# Patient Record
Sex: Male | Born: 1950
Health system: Southern US, Community
[De-identification: ages and names within clinical notes are randomized; demographics above are authoritative.]

## PROBLEM LIST (undated history)

## (undated) DIAGNOSIS — C801 Malignant (primary) neoplasm, unspecified: Secondary | ICD-10-CM

## (undated) DIAGNOSIS — D649 Anemia, unspecified: Secondary | ICD-10-CM

## (undated) DIAGNOSIS — I10 Essential (primary) hypertension: Secondary | ICD-10-CM

## (undated) DIAGNOSIS — R51 Headache: Secondary | ICD-10-CM

## (undated) DIAGNOSIS — J329 Chronic sinusitis, unspecified: Secondary | ICD-10-CM

## (undated) DIAGNOSIS — I499 Cardiac arrhythmia, unspecified: Secondary | ICD-10-CM

## (undated) DIAGNOSIS — K219 Gastro-esophageal reflux disease without esophagitis: Secondary | ICD-10-CM

## (undated) DIAGNOSIS — E785 Hyperlipidemia, unspecified: Secondary | ICD-10-CM

## (undated) DIAGNOSIS — Z9289 Personal history of other medical treatment: Secondary | ICD-10-CM

## (undated) HISTORY — DX: Gastro-esophageal reflux disease without esophagitis: K21.9

## (undated) HISTORY — PX: ELBOW SURGERY: SHX618

## (undated) HISTORY — DX: Essential (primary) hypertension: I10

## (undated) HISTORY — PX: NASAL SINUS SURGERY: SHX719

## (undated) HISTORY — DX: Hyperlipidemia, unspecified: E78.5

## (undated) HISTORY — PX: TONSILLECTOMY: SUR1361

---

## 2003-01-24 ENCOUNTER — Ambulatory Visit (HOSPITAL_COMMUNITY): Admission: RE | Admit: 2003-01-24 | Discharge: 2003-01-24 | Payer: Self-pay | Admitting: Family Medicine

## 2003-01-24 ENCOUNTER — Encounter: Payer: Self-pay | Admitting: Family Medicine

## 2003-08-30 ENCOUNTER — Ambulatory Visit (HOSPITAL_COMMUNITY): Admission: RE | Admit: 2003-08-30 | Discharge: 2003-08-30 | Payer: Self-pay | Admitting: Family Medicine

## 2004-08-05 ENCOUNTER — Ambulatory Visit (HOSPITAL_COMMUNITY): Admission: RE | Admit: 2004-08-05 | Discharge: 2004-08-05 | Payer: Self-pay | Admitting: Orthopedic Surgery

## 2004-08-05 ENCOUNTER — Ambulatory Visit (HOSPITAL_BASED_OUTPATIENT_CLINIC_OR_DEPARTMENT_OTHER): Admission: RE | Admit: 2004-08-05 | Discharge: 2004-08-05 | Payer: Self-pay | Admitting: Orthopedic Surgery

## 2012-03-29 ENCOUNTER — Other Ambulatory Visit: Payer: Self-pay | Admitting: Family Medicine

## 2012-03-29 DIAGNOSIS — R1011 Right upper quadrant pain: Secondary | ICD-10-CM

## 2012-03-30 ENCOUNTER — Other Ambulatory Visit: Payer: Self-pay

## 2012-03-31 ENCOUNTER — Ambulatory Visit
Admission: RE | Admit: 2012-03-31 | Discharge: 2012-03-31 | Disposition: A | Discharge status: Home / Self Care | Payer: Managed Care, Other (non HMO) | Source: Ambulatory Visit | Attending: Family Medicine | Admitting: Family Medicine

## 2012-03-31 DIAGNOSIS — R1011 Right upper quadrant pain: Secondary | ICD-10-CM

## 2012-04-19 ENCOUNTER — Other Ambulatory Visit (HOSPITAL_COMMUNITY): Payer: Self-pay | Admitting: Family Medicine

## 2012-04-19 DIAGNOSIS — R109 Unspecified abdominal pain: Secondary | ICD-10-CM

## 2012-04-19 DIAGNOSIS — R110 Nausea: Secondary | ICD-10-CM

## 2012-04-28 ENCOUNTER — Encounter (HOSPITAL_COMMUNITY)
Admission: RE | Admit: 2012-04-28 | Discharge: 2012-04-28 | Disposition: A | Discharge status: Home / Self Care | Payer: Managed Care, Other (non HMO) | Source: Ambulatory Visit | Attending: Family Medicine | Admitting: Family Medicine

## 2012-04-28 DIAGNOSIS — R109 Unspecified abdominal pain: Secondary | ICD-10-CM

## 2012-04-28 DIAGNOSIS — R110 Nausea: Secondary | ICD-10-CM | POA: Insufficient documentation

## 2012-04-28 DIAGNOSIS — R1011 Right upper quadrant pain: Secondary | ICD-10-CM | POA: Insufficient documentation

## 2012-04-28 MED ORDER — TECHNETIUM TC 99M MEBROFENIN IV KIT
5.3000 | PACK | Freq: Once | INTRAVENOUS | Status: AC | PRN
  Administered 2012-04-28: 5.3 via INTRAVENOUS

## 2012-05-16 ENCOUNTER — Ambulatory Visit (INDEPENDENT_AMBULATORY_CARE_PROVIDER_SITE_OTHER): Payer: Managed Care, Other (non HMO) | Admitting: General Surgery

## 2012-05-16 ENCOUNTER — Encounter (INDEPENDENT_AMBULATORY_CARE_PROVIDER_SITE_OTHER): Payer: Self-pay | Admitting: General Surgery

## 2012-05-16 VITALS — BP 126/72 | HR 68 | Temp 97.0°F | Resp 16 | Ht 68.0 in | Wt 190.8 lb

## 2012-05-16 DIAGNOSIS — K828 Other specified diseases of gallbladder: Secondary | ICD-10-CM

## 2012-05-16 NOTE — Progress Notes (Signed)
Patient ID: Kevin Mahoney, male   DOB: 08/13/1951, 61 y.o.   MRN: [id number]  Chief Complaint  Patient presents with  . Pre-op Exam    eval biliary dyskinesia    HPI Kevin Mahoney is a 61 y.o. male.  This patient is referred by Dr. Thacker for evaluation of biliary dyskinesia. He says that over the last few months he has had nausea, bloating, and right upper quadrant abdominal pain. He says that this has been present for the last few months and describes the pain as a burning and pressure sensation which is exacerbated by eating. He says it happens with most any food and has not necessarily noted any exacerbation with fatty foods. He says that the discomfort comes on quickly after eating and lasts for hours and is now occurring almost daily. He also describes some nonexertional chest pain and some heartburn which is well controlled with Prilosec. His physician actually put him on twice a day Prilosec to see if this would make a difference of and it did not improve his symptoms as well. He says that his bowels are normal anatomy normal colonoscopy 3 years ago per patient report. He denies any blood in the stools or black stools. He has had 5-6 pound weight loss.  He did have a negative RUQ US but HIDA demonstrated a 14%EF. HPI  Past Medical History  Diagnosis Date  . Hyperlipidemia   . Hypertension   . GERD (gastroesophageal reflux disease)     Past Surgical History  Procedure Date  . Elbow surgery     Family History  Problem Relation Age of Onset  . Cancer Father     lung  . Cancer Maternal Grandmother     unknown - possibly bone    Social History History  Substance Use Topics  . Smoking status: Never Smoker   . Smokeless tobacco: Never Used  . Alcohol Use: No    No Known Allergies  Current Outpatient Prescriptions  Medication Sig Dispense Refill  . omeprazole (PRILOSEC) 20 MG capsule Take 20 mg by mouth daily.      . simvastatin (ZOCOR) 40 MG tablet         Review of  Systems Review of Systems All other review of systems negative or noncontributory except as stated in the HPI  Blood pressure 126/72, pulse 68, temperature 97 F (36.1 C), temperature source Temporal, resp. rate 16, height 5' 8" (1.727 m), weight 190 lb 12.8 oz (86.546 kg).  Physical Exam Physical Exam Physical Exam  Vitals reviewed. Constitutional: He is oriented to person, place, and time. He appears well-developed and well-nourished. No distress.  HENT:  Head: Normocephalic and atraumatic.  Mouth/Throat: No oropharyngeal exudate.  Eyes: Conjunctivae and EOM are normal. Pupils are equal, round, and reactive to light. Right eye exhibits no discharge. Left eye exhibits no discharge. No scleral icterus.  Neck: Normal range of motion. No tracheal deviation present.  Cardiovascular: Normal rate, regular rhythm and normal heart sounds.   Pulmonary/Chest: Effort normal and breath sounds normal. No stridor. No respiratory distress. He has no wheezes. He has no rales. He exhibits no tenderness.  Abdominal: Soft. Bowel sounds are normal. He exhibits no distension and no mass. There is no tenderness. There is no rebound and no guarding.  Musculoskeletal: Normal range of motion. He exhibits no edema and no tenderness.  Neurological: He is alert and oriented to person, place, and time.  Skin: Skin is warm and dry. No rash noted. He is not   diaphoretic. No erythema. No pallor.  Psychiatric: He has a normal mood and affect. His behavior is normal. Judgment and thought content normal.    Data Reviewed US, HIDA  Assessment    Biliary dyskinesia His symptoms do sound concerning for biliary dyskinesia. He does have the abdominal pain, bloating and nausea as well as a the skin with an ejection fraction of 14%. We'll long discussion about the options for continued workup versus proceeding with surgery and I think that we have enough evidence to go ahead and offer him a cholecystectomy for possible  relief. I explained that his biggest risk of the surgery would be that he has continued symptoms postoperatively. We also discussed the risks of cholecystectomy. The risks of infection, bleeding, pain, persistent symptoms, scarring, injury to bowel or bile ducts, retained stone, diarrhea, need for additional procedures, and need for open surgery discussed with the patient.  Since he has daily symptoms and he says that his quality-of-life is suffering, he would like to go ahead and proceed with cholecystectomy for possible relief. We will go ahead and set him up for cholecystectomy when convenient     Plan    We will schedule him for laparoscopic cholecystectomy for possible relief of his symptoms.      Iniya Matzek DAVID 05/16/2012, 4:37 PM    

## 2012-05-25 ENCOUNTER — Encounter (HOSPITAL_COMMUNITY): Payer: Self-pay | Admitting: Pharmacy Technician

## 2012-05-31 NOTE — Pre-Procedure Instructions (Signed)
21 Kevin Mahoney  05/31/2012   Your procedure is scheduled on:  Thursday October 234, 2013  Report to [facility] at 5:30 AM.  Call this number if you have problems the morning of surgery: [phone]   Remember:   Do not eat food or drink After Midnight.      Take these medicines the morning of surgery with A SIP OF WATER: omeprazole   Do not wear jewelry, make-up or nail polish.  Do not wear lotions, powders, or perfumes.   Do not shave 48 hours prior to surgery. Men may shave face and neck.  Do not bring valuables to the hospital.  Contacts, dentures or bridgework may not be worn into surgery.  Leave suitcase in the car. After surgery it may be brought to your room.  For patients admitted to the hospital, checkout time is 11:00 AM the day of discharge.   Patients discharged the day of surgery will not be allowed to drive home.  Name and phone number of your driver: family / friend  Special Instructions: Shower using CHG 2 nights before surgery and the night before surgery.  If you shower the day of surgery use CHG.  Use special wash - you have one bottle of CHG for all showers.  You should use approximately 1/3 of the bottle for each shower.   Please read over the following fact sheets that you were given: Pain Booklet, Coughing and Deep Breathing, MRSA Information and Surgical Site Infection Prevention

## 2012-06-01 ENCOUNTER — Ambulatory Visit (HOSPITAL_COMMUNITY)
Admission: RE | Admit: 2012-06-01 | Discharge: 2012-06-01 | Disposition: A | Discharge status: Home / Self Care | Payer: Managed Care, Other (non HMO) | Source: Ambulatory Visit | Attending: General Surgery | Admitting: General Surgery

## 2012-06-01 ENCOUNTER — Encounter (HOSPITAL_COMMUNITY): Payer: Self-pay

## 2012-06-01 ENCOUNTER — Encounter (HOSPITAL_COMMUNITY)
Admission: RE | Admit: 2012-06-01 | Discharge: 2012-06-01 | Payer: Managed Care, Other (non HMO) | Source: Ambulatory Visit | Attending: General Surgery | Admitting: General Surgery

## 2012-06-01 VITALS — BP 127/84 | HR 73 | Temp 98.5°F | Resp 20 | Ht 68.0 in | Wt 195.8 lb

## 2012-06-01 DIAGNOSIS — Z01810 Encounter for preprocedural cardiovascular examination: Secondary | ICD-10-CM | POA: Insufficient documentation

## 2012-06-01 DIAGNOSIS — K828 Other specified diseases of gallbladder: Secondary | ICD-10-CM

## 2012-06-01 DIAGNOSIS — Z01818 Encounter for other preprocedural examination: Secondary | ICD-10-CM | POA: Insufficient documentation

## 2012-06-01 DIAGNOSIS — I10 Essential (primary) hypertension: Secondary | ICD-10-CM | POA: Insufficient documentation

## 2012-06-01 DIAGNOSIS — Z01812 Encounter for preprocedural laboratory examination: Secondary | ICD-10-CM | POA: Insufficient documentation

## 2012-06-01 HISTORY — DX: Chronic sinusitis, unspecified: J32.9

## 2012-06-01 HISTORY — DX: Personal history of other medical treatment: Z92.89

## 2012-06-01 HISTORY — DX: Anemia, unspecified: D64.9

## 2012-06-01 HISTORY — DX: Malignant (primary) neoplasm, unspecified: C80.1

## 2012-06-01 HISTORY — DX: Cardiac arrhythmia, unspecified: I49.9

## 2012-06-01 HISTORY — DX: Headache: R51

## 2012-06-01 LAB — CBC WITH DIFFERENTIAL/PLATELET
Basophils Absolute: 0.1 10*3/uL (ref 0.0–0.1)
Eosinophils Relative: 5 % (ref 0–5)
Lymphocytes Relative: 35 % (ref 12–46)
Monocytes Relative: 9 % (ref 3–12)
Neutrophils Relative %: 50 % (ref 43–77)
Platelets: 199 10*3/uL (ref 150–400)
RBC: 4.96 MIL/uL (ref 4.22–5.81)
RDW: 19.3 % — ABNORMAL HIGH (ref 11.5–15.5)
WBC: 5.5 10*3/uL (ref 4.0–10.5)

## 2012-06-01 LAB — COMPREHENSIVE METABOLIC PANEL
ALT: 19 U/L (ref 0–53)
AST: 32 U/L (ref 0–37)
Albumin: 4 g/dL (ref 3.5–5.2)
CO2: 26 mEq/L (ref 19–32)
Chloride: 104 mEq/L (ref 96–112)
GFR calc non Af Amer: >90 mL/min (ref >90)
Sodium: 139 mEq/L (ref 135–145)
Total Bilirubin: 0.3 mg/dL (ref 0.3–1.2)

## 2012-06-01 LAB — SURGICAL PCR SCREEN
MRSA, PCR: NEGATIVE
Staphylococcus aureus: POSITIVE — AB

## 2012-06-06 ENCOUNTER — Telehealth (INDEPENDENT_AMBULATORY_CARE_PROVIDER_SITE_OTHER): Payer: Self-pay

## 2012-06-06 NOTE — Telephone Encounter (Signed)
The pt called stating he is being treated for a sinus infection with Bactrim.  His surgery is 10/24.  He wanted to know if he should take it the morning of surgery.  I told him not to.  I told him to make sure he lets the hospital know he is on it and Anesthesia will check him that morning.

## 2012-06-07 MED ORDER — CEFAZOLIN SODIUM-DEXTROSE 2-3 GM-% IV SOLR
2.0000 g | INTRAVENOUS | Status: AC
  Administered 2012-06-08: 2 g via INTRAVENOUS
  Filled 2012-06-07: qty 50

## 2012-06-08 ENCOUNTER — Ambulatory Visit (HOSPITAL_COMMUNITY)
Admission: RE | Admit: 2012-06-08 | Discharge: 2012-06-08 | Disposition: A | Discharge status: Home / Self Care | Payer: Managed Care, Other (non HMO) | Source: Ambulatory Visit | Attending: General Surgery | Admitting: General Surgery

## 2012-06-08 ENCOUNTER — Encounter (HOSPITAL_COMMUNITY): Payer: Self-pay | Admitting: Surgery

## 2012-06-08 ENCOUNTER — Ambulatory Visit (HOSPITAL_COMMUNITY): Payer: Managed Care, Other (non HMO)

## 2012-06-08 ENCOUNTER — Encounter (HOSPITAL_COMMUNITY): Payer: Self-pay | Admitting: Anesthesiology

## 2012-06-08 ENCOUNTER — Ambulatory Visit (HOSPITAL_COMMUNITY): Payer: Managed Care, Other (non HMO) | Admitting: Anesthesiology

## 2012-06-08 ENCOUNTER — Encounter (HOSPITAL_COMMUNITY)
Admission: RE | Disposition: A | Discharge status: Home / Self Care | Payer: Self-pay | Source: Ambulatory Visit | Attending: General Surgery

## 2012-06-08 DIAGNOSIS — K811 Chronic cholecystitis: Secondary | ICD-10-CM

## 2012-06-08 DIAGNOSIS — K828 Other specified diseases of gallbladder: Secondary | ICD-10-CM | POA: Insufficient documentation

## 2012-06-08 DIAGNOSIS — I10 Essential (primary) hypertension: Secondary | ICD-10-CM | POA: Insufficient documentation

## 2012-06-08 HISTORY — PX: CHOLECYSTECTOMY: SHX55

## 2012-06-08 SURGERY — LAPAROSCOPIC CHOLECYSTECTOMY WITH INTRAOPERATIVE CHOLANGIOGRAM
Anesthesia: General | Wound class: Contaminated

## 2012-06-08 MED ORDER — LIDOCAINE-EPINEPHRINE (PF) 1 %-1:200000 IJ SOLN
INTRAMUSCULAR | Status: DC | PRN
  Administered 2012-06-08: 09:00:00 via SUBCUTANEOUS

## 2012-06-08 MED ORDER — DEXTROSE 5 % IV SOLN
INTRAVENOUS | Status: DC | PRN
  Administered 2012-06-08: 08:00:00 via INTRAVENOUS

## 2012-06-08 MED ORDER — HYDROMORPHONE HCL PF 1 MG/ML IJ SOLN
0.2500 mg | INTRAMUSCULAR | Status: DC | PRN
  Administered 2012-06-08 (×4): 0.5 mg via INTRAVENOUS

## 2012-06-08 MED ORDER — LIDOCAINE HCL (CARDIAC) 20 MG/ML IV SOLN
INTRAVENOUS | Status: DC | PRN
  Administered 2012-06-08: 90 mg via INTRAVENOUS

## 2012-06-08 MED ORDER — GLYCOPYRROLATE 0.2 MG/ML IJ SOLN
INTRAMUSCULAR | Status: DC | PRN
  Administered 2012-06-08: .4 mg via INTRAVENOUS
  Administered 2012-06-08: 0.4 mg via INTRAVENOUS

## 2012-06-08 MED ORDER — FENTANYL CITRATE 0.05 MG/ML IJ SOLN
INTRAMUSCULAR | Status: DC | PRN
  Administered 2012-06-08 (×3): 50 ug via INTRAVENOUS

## 2012-06-08 MED ORDER — LIDOCAINE-EPINEPHRINE (PF) 1 %-1:200000 IJ SOLN
INTRAMUSCULAR | Status: AC
  Filled 2012-06-08: qty 10

## 2012-06-08 MED ORDER — SODIUM CHLORIDE 0.9 % IR SOLN
Status: DC | PRN
  Administered 2012-06-08: 1000 mL

## 2012-06-08 MED ORDER — 0.9 % SODIUM CHLORIDE (POUR BTL) OPTIME
TOPICAL | Status: DC | PRN
  Administered 2012-06-08: 1000 mL

## 2012-06-08 MED ORDER — METOCLOPRAMIDE HCL 5 MG/ML IJ SOLN
10.0000 mg | Freq: Once | INTRAMUSCULAR | Status: AC | PRN
  Administered 2012-06-08: 10 mg via INTRAVENOUS

## 2012-06-08 MED ORDER — METOCLOPRAMIDE HCL 5 MG/ML IJ SOLN
INTRAMUSCULAR | Status: AC
  Filled 2012-06-08: qty 2

## 2012-06-08 MED ORDER — SODIUM CHLORIDE 0.9 % IV SOLN
INTRAVENOUS | Status: DC | PRN
  Administered 2012-06-08: 09:00:00

## 2012-06-08 MED ORDER — DEXAMETHASONE SODIUM PHOSPHATE 4 MG/ML IJ SOLN
INTRAMUSCULAR | Status: DC | PRN
  Administered 2012-06-08: 10 mg via INTRAVENOUS

## 2012-06-08 MED ORDER — BUPIVACAINE HCL (PF) 0.25 % IJ SOLN
INTRAMUSCULAR | Status: AC
  Filled 2012-06-08: qty 30

## 2012-06-08 MED ORDER — LACTATED RINGERS IV SOLN
INTRAVENOUS | Status: DC | PRN
  Administered 2012-06-08 (×2): via INTRAVENOUS

## 2012-06-08 MED ORDER — HYDROMORPHONE HCL PF 1 MG/ML IJ SOLN
INTRAMUSCULAR | Status: AC
  Filled 2012-06-08: qty 1

## 2012-06-08 MED ORDER — HYDROCODONE-ACETAMINOPHEN 5-325 MG PO TABS: 1.0000 | ORAL_TABLET | ORAL | Status: DC | PRN

## 2012-06-08 MED ORDER — PROPOFOL 10 MG/ML IV BOLUS
INTRAVENOUS | Status: DC | PRN
  Administered 2012-06-08: 200 mg via INTRAVENOUS

## 2012-06-08 MED ORDER — OXYCODONE HCL 5 MG PO TABS: 5.0000 mg | ORAL_TABLET | Freq: Once | ORAL | Status: DC | PRN

## 2012-06-08 MED ORDER — NEOSTIGMINE METHYLSULFATE 1 MG/ML IJ SOLN
INTRAMUSCULAR | Status: DC | PRN
  Administered 2012-06-08: 2 mg via INTRAVENOUS
  Administered 2012-06-08: 3 mg via INTRAVENOUS

## 2012-06-08 MED ORDER — ONDANSETRON HCL 4 MG/2ML IJ SOLN
INTRAMUSCULAR | Status: DC | PRN
  Administered 2012-06-08: 4 mg via INTRAVENOUS

## 2012-06-08 MED ORDER — OXYCODONE HCL 5 MG/5ML PO SOLN: 5.0000 mg | Freq: Once | ORAL | Status: DC | PRN

## 2012-06-08 MED ORDER — OXYCODONE HCL 5 MG PO TABS
ORAL_TABLET | ORAL | Status: AC
  Filled 2012-06-08: qty 1

## 2012-06-08 MED ORDER — ROCURONIUM BROMIDE 100 MG/10ML IV SOLN
INTRAVENOUS | Status: DC | PRN
  Administered 2012-06-08: 50 mg via INTRAVENOUS

## 2012-06-08 MED ORDER — MIDAZOLAM HCL 5 MG/5ML IJ SOLN
INTRAMUSCULAR | Status: DC | PRN
  Administered 2012-06-08 (×2): 1 mg via INTRAVENOUS

## 2012-06-08 SURGICAL SUPPLY — 49 items
ADH SKN CLS APL DERMABOND .7 (GAUZE/BANDAGES/DRESSINGS) ×1
APPLIER CLIP ROT 10 11.4 M/L (STAPLE) ×2
APR CLP MED LRG 11.4X10 (STAPLE) ×1
BAG SPEC RTRVL LRG 6X4 10 (ENDOMECHANICALS) ×1
BLADE SURG ROTATE 9660 (MISCELLANEOUS) ×1 IMPLANT
CANISTER SUCTION 2500CC (MISCELLANEOUS) ×2 IMPLANT
CATH REDDICK CHOLANGI 4FR 50CM (CATHETERS) ×2 IMPLANT
CHLORAPREP W/TINT 26ML (MISCELLANEOUS) ×2 IMPLANT
CLIP APPLIE ROT 10 11.4 M/L (STAPLE) ×1 IMPLANT
CLOTH BEACON ORANGE TIMEOUT ST (SAFETY) ×2 IMPLANT
COVER SURGICAL LIGHT HANDLE (MISCELLANEOUS) ×2 IMPLANT
DECANTER SPIKE VIAL GLASS SM (MISCELLANEOUS) ×4 IMPLANT
DERMABOND ADVANCED (GAUZE/BANDAGES/DRESSINGS) ×1
DERMABOND ADVANCED .7 DNX12 (GAUZE/BANDAGES/DRESSINGS) ×1 IMPLANT
DRAPE C-ARM 42X72 X-RAY (DRAPES) ×2 IMPLANT
ELECT CAUTERY BLADE 6.4 (BLADE) ×2 IMPLANT
ELECT REM PT RETURN 9FT ADLT (ELECTROSURGICAL) ×2
ELECTRODE REM PT RTRN 9FT ADLT (ELECTROSURGICAL) ×1 IMPLANT
GLOVE BIO SURGEON STRL SZ7.5 (GLOVE) ×1 IMPLANT
GLOVE BIOGEL PI IND STRL 7.0 (GLOVE) IMPLANT
GLOVE BIOGEL PI IND STRL 7.5 (GLOVE) IMPLANT
GLOVE BIOGEL PI IND STRL 8 (GLOVE) IMPLANT
GLOVE BIOGEL PI INDICATOR 7.0 (GLOVE) ×1
GLOVE BIOGEL PI INDICATOR 7.5 (GLOVE) ×1
GLOVE BIOGEL PI INDICATOR 8 (GLOVE) ×2
GLOVE SURG SS PI 7.5 STRL IVOR (GLOVE) ×4 IMPLANT
GLOVE SURG SS PI 8.0 STRL IVOR (GLOVE) ×2 IMPLANT
GOWN STRL NON-REIN LRG LVL3 (GOWN DISPOSABLE) ×6 IMPLANT
GOWN STRL REIN XL XLG (GOWN DISPOSABLE) ×2 IMPLANT
IV CATH 14GX2 1/4 (CATHETERS) ×2 IMPLANT
KIT BASIN OR (CUSTOM PROCEDURE TRAY) ×2 IMPLANT
KIT ROOM TURNOVER OR (KITS) ×2 IMPLANT
NS IRRIG 1000ML POUR BTL (IV SOLUTION) ×2 IMPLANT
PAD ARMBOARD 7.5X6 YLW CONV (MISCELLANEOUS) ×2 IMPLANT
PENCIL BUTTON HOLSTER BLD 10FT (ELECTRODE) ×2 IMPLANT
POUCH SPECIMEN RETRIEVAL 10MM (ENDOMECHANICALS) ×2 IMPLANT
SCISSORS LAP 5X35 DISP (ENDOMECHANICALS) IMPLANT
SET IRRIG TUBING LAPAROSCOPIC (IRRIGATION / IRRIGATOR) ×2 IMPLANT
SLEEVE ENDOPATH XCEL 5M (ENDOMECHANICALS) ×2 IMPLANT
SPECIMEN JAR SMALL (MISCELLANEOUS) ×2 IMPLANT
SUT MNCRL AB 4-0 PS2 18 (SUTURE) ×4 IMPLANT
SUT VICRYL 0 UR6 27IN ABS (SUTURE) ×2 IMPLANT
TOWEL OR 17X24 6PK STRL BLUE (TOWEL DISPOSABLE) ×2 IMPLANT
TOWEL OR 17X26 10 PK STRL BLUE (TOWEL DISPOSABLE) ×2 IMPLANT
TRAY FOLEY CATH 14FR (SET/KITS/TRAYS/PACK) IMPLANT
TRAY LAPAROSCOPIC (CUSTOM PROCEDURE TRAY) ×2 IMPLANT
TROCAR BALLN 12MMX100 BLUNT (TROCAR) ×2 IMPLANT
TROCAR XCEL NON-BLD 11X100MML (ENDOMECHANICALS) ×2 IMPLANT
TROCAR XCEL NON-BLD 5MMX100MML (ENDOMECHANICALS) ×2 IMPLANT

## 2012-06-08 NOTE — Anesthesia Preprocedure Evaluation (Signed)
Anesthesia Evaluation  Patient identified by MRN, date of birth, ID band Patient awake    Reviewed: Allergy & Precautions, H&P , NPO status , Patient's Chart, lab work & pertinent test results, reviewed documented beta blocker date and time   Airway Mallampati: II TM Distance: >3 FB Neck ROM: full    Dental   Pulmonary neg pulmonary ROS,  breath sounds clear to auscultation        Cardiovascular hypertension, Pt. on medications negative cardio ROS  + dysrhythmias Rhythm:regular     Neuro/Psych  Headaches, negative psych ROS   GI/Hepatic Neg liver ROS, GERD-  Medicated and Controlled,  Endo/Other  negative endocrine ROS  Renal/GU negative Renal ROS  negative genitourinary   Musculoskeletal   Abdominal   Peds  Hematology negative hematology ROS (+)   Anesthesia Other Findings See surgeon's H&P   Reproductive/Obstetrics negative OB ROS                           Anesthesia Physical Anesthesia Plan  ASA: II  Anesthesia Plan: General   Post-op Pain Management:    Induction: Intravenous  Airway Management Planned: Oral ETT  Additional Equipment:   Intra-op Plan:   Post-operative Plan: Extubation in OR  Informed Consent: I have reviewed the patients History and Physical, chart, labs and discussed the procedure including the risks, benefits and alternatives for the proposed anesthesia with the patient or authorized representative who has indicated his/her understanding and acceptance.   Dental Advisory Given  Plan Discussed with: CRNA and Surgeon  Anesthesia Plan Comments:         Anesthesia Quick Evaluation

## 2012-06-08 NOTE — H&P (View-Only) (Signed)
Patient ID: Kevin Mahoney, male   DOB: 11/11/1950, 61 y.o.   MRN: [id number]  Chief Complaint  Patient presents with  . Pre-op Exam    eval biliary dyskinesia    HPI Kevin Mahoney is a 61 y.o. male.  This patient is referred by Dr. Abigail Miyamoto for evaluation of biliary dyskinesia. He says that over the last few months he has had nausea, bloating, and right upper quadrant abdominal pain. He says that this has been present for the last few months and describes the pain as a burning and pressure sensation which is exacerbated by eating. He says it happens with most any food and has not necessarily noted any exacerbation with fatty foods. He says that the discomfort comes on quickly after eating and lasts for hours and is now occurring almost daily. He also describes some nonexertional chest pain and some heartburn which is well controlled with Prilosec. His physician actually put him on twice a day Prilosec to see if this would make a difference of and it did not improve his symptoms as well. He says that his bowels are normal anatomy normal colonoscopy 3 years ago per patient report. He denies any blood in the stools or black stools. He has had 5-6 pound weight loss.  He did have a negative RUQ [place] but HIDA demonstrated a 14%EF. HPI  Past Medical History  Diagnosis Date  . Hyperlipidemia   . Hypertension   . GERD (gastroesophageal reflux disease)     Past Surgical History  Procedure Date  . Elbow surgery     Family History  Problem Relation Age of Onset  . Cancer Father     lung  . Cancer Maternal Grandmother     unknown - possibly bone    Social History History  Substance Use Topics  . Smoking status: Never Smoker   . Smokeless tobacco: Never Used  . Alcohol Use: No    No Known Allergies  Current Outpatient Prescriptions  Medication Sig Dispense Refill  . omeprazole (PRILOSEC) 20 MG capsule Take 20 mg by mouth daily.      . simvastatin (ZOCOR) 40 MG tablet         Review of  Systems Review of Systems All other review of systems negative or noncontributory except as stated in the HPI  Blood pressure 126/72, pulse 68, temperature 97 F (36.1 C), temperature source Temporal, resp. rate 16, height 5\' 8"  (1.727 m), weight 190 lb 12.8 oz (86.546 kg).  Physical Exam Physical Exam Physical Exam  Vitals reviewed. Constitutional: He is oriented to person, place, and time. He appears well-developed and well-nourished. No distress.  HENT:  Head: Normocephalic and atraumatic.  Mouth/Throat: No oropharyngeal exudate.  Eyes: Conjunctivae and EOM are normal. Pupils are equal, round, and reactive to light. Right eye exhibits no discharge. Left eye exhibits no discharge. No scleral icterus.  Neck: Normal range of motion. No tracheal deviation present.  Cardiovascular: Normal rate, regular rhythm and normal heart sounds.   Pulmonary/Chest: Effort normal and breath sounds normal. No stridor. No respiratory distress. He has no wheezes. He has no rales. He exhibits no tenderness.  Abdominal: Soft. Bowel sounds are normal. He exhibits no distension and no mass. There is no tenderness. There is no rebound and no guarding.  Musculoskeletal: Normal range of motion. He exhibits no edema and no tenderness.  Neurological: He is alert and oriented to person, place, and time.  Skin: Skin is warm and dry. No rash noted. He is not  diaphoretic. No erythema. No pallor.  Psychiatric: He has a normal mood and affect. His behavior is normal. Judgment and thought content normal.    Data Reviewed [place], HIDA  Assessment    Biliary dyskinesia His symptoms do sound concerning for biliary dyskinesia. He does have the abdominal pain, bloating and nausea as well as a the skin with an ejection fraction of 14%. We'll long discussion about the options for continued workup versus proceeding with surgery and I think that we have enough evidence to go ahead and offer him a cholecystectomy for possible  relief. I explained that his biggest risk of the surgery would be that he has continued symptoms postoperatively. We also discussed the risks of cholecystectomy. The risks of infection, bleeding, pain, persistent symptoms, scarring, injury to bowel or bile ducts, retained stone, diarrhea, need for additional procedures, and need for open surgery discussed with the patient.  Since he has daily symptoms and he says that his quality-of-life is suffering, he would like to go ahead and proceed with cholecystectomy for possible relief. We will go ahead and set him up for cholecystectomy when convenient     Plan    We will schedule him for laparoscopic cholecystectomy for possible relief of his symptoms.      Lodema Pilot DAVID 05/16/2012, 4:37 PM

## 2012-06-08 NOTE — Progress Notes (Signed)
Pt nauseated when moved up to wheelchair. Reglan IV given as ordered.  Pt feeling better.

## 2012-06-08 NOTE — Transfer of Care (Signed)
Immediate Anesthesia Transfer of Care Note  Patient: Kevin Mahoney  Procedure(s) Performed: Procedure(s) (LRB) with comments: LAPAROSCOPIC CHOLECYSTECTOMY WITH INTRAOPERATIVE CHOLANGIOGRAM (N/A)  Patient Location: PACU  Anesthesia Type: General  Level of Consciousness: awake, alert  and patient cooperative  Airway & Oxygen Therapy: Patient Spontanous Breathing and Patient connected to face mask oxygen  Post-op Assessment: Report given to PACU RN, Post -op Vital signs reviewed and stable and Patient moving all extremities  Post vital signs: Reviewed and stable  Complications: No apparent anesthesia complications

## 2012-06-08 NOTE — Addendum Note (Signed)
Addendum  created 06/08/12 1615 by Marni Griffon, CRNA   Modules edited:Anesthesia Medication Administration

## 2012-06-08 NOTE — Anesthesia Postprocedure Evaluation (Signed)
Anesthesia Post Note  Patient: Kevin Mahoney  Procedure(s) Performed: Procedure(s) (LRB): LAPAROSCOPIC CHOLECYSTECTOMY WITH INTRAOPERATIVE CHOLANGIOGRAM (N/A)  Anesthesia type: General  Patient location: PACU  Post pain: Pain level controlled  Post assessment: Patient's Cardiovascular Status Stable  Last Vitals:  Filed Vitals:   06/08/12 1030  BP: 134/74  Pulse: 83  Temp:   Resp: 16    Post vital signs: Reviewed and stable  Level of consciousness: alert  Complications: No apparent anesthesia complications

## 2012-06-08 NOTE — Anesthesia Procedure Notes (Signed)
Procedure Name: Intubation Date/Time: 06/08/2012 7:53 AM Performed by: Marni Griffon Pre-anesthesia Checklist: Patient identified, Emergency Drugs available, Suction available and Patient being monitored Patient Re-evaluated:Patient Re-evaluated prior to inductionOxygen Delivery Method: Circle system utilized Preoxygenation: Pre-oxygenation with 100% oxygen Intubation Type: IV induction Ventilation: Mask ventilation without difficulty Laryngoscope Size: Mac and 3 (could have used length of Mac 4) Grade View: Grade II Tube type: Oral Tube size: 8.0 mm Number of attempts: 1 Airway Equipment and Method: Stylet Placement Confirmation: ETT inserted through vocal cords under direct vision,  positive ETCO2,  CO2 detector and breath sounds checked- equal and bilateral Secured at: 22 (cm at teeth) cm Tube secured with: Tape Dental Injury: Teeth and Oropharynx as per pre-operative assessment

## 2012-06-08 NOTE — Preoperative (Signed)
Beta Blockers   Reason not to administer Beta Blockers:Not Applicable 

## 2012-06-08 NOTE — Interval H&P Note (Signed)
History and Physical Interval Note:  06/08/2012 7:23 AM  Kevin Mahoney  has presented today for surgery, with the diagnosis of bilinary dyskinesia  The various methods of treatment have been discussed with the patient and family. After consideration of risks, benefits and other options for treatment, the patient has consented to  Procedure(s) (LRB) with comments: LAPAROSCOPIC CHOLECYSTECTOMY WITH INTRAOPERATIVE CHOLANGIOGRAM (N/A) as a surgical intervention .  The patient's history has been reviewed, patient examined, no change in status, stable for surgery.  I have reviewed the patient's chart and labs.  Questions were answered to the patient's satisfaction.  He was seen and evaluated in the preop area.  Risks of the procedure again discussed.  The risks of infection, bleeding, pain, persistent symptoms, scarring, injury to bowel or bile ducts, retained stone, diarrhea, need for additional procedures, and need for open surgery discussed with the patient.  Since he has no stones, I again explained that his biggest risk may be that he has persistent symptoms.  He expressed understanding and desires to proceed with lap chole/IOC possible open.    Lodema Pilot DAVID

## 2012-06-08 NOTE — Brief Op Note (Signed)
06/08/2012  9:23 AM  PATIENT:  Kevin Mahoney  61 y.o. male  PRE-OPERATIVE DIAGNOSIS:  bilinary dyskinesia  POST-OPERATIVE DIAGNOSIS:  bilinary dyskinesia  PROCEDURE:  Procedure(s) (LRB) with comments: LAPAROSCOPIC CHOLECYSTECTOMY WITH INTRAOPERATIVE CHOLANGIOGRAM (N/A)  SURGEON:  Surgeon(s) and Role:    * Lodema Pilot, DO - Primary  PHYSICIAN ASSISTANT:   ASSISTANTS: none   ANESTHESIA:   general  EBL:  Total I/O In: 1050 [I.V.:1050] Out: -   BLOOD ADMINISTERED:none  DRAINS: none   LOCAL MEDICATIONS USED:  MARCAINE    and LIDOCAINE   SPECIMEN:  Source of Specimen:  gallbladder  DISPOSITION OF SPECIMEN:  PATHOLOGY  COUNTS:  YES  TOURNIQUET:  * No tourniquets in log *  DICTATION: .Other Dictation: Dictation Number   PLAN OF CARE: Discharge to home after PACU  PATIENT DISPOSITION:  PACU - hemodynamically stable.   Delay start of Pharmacological VTE agent (>24hrs) due to surgical blood loss or risk of bleeding: no

## 2012-06-08 NOTE — Op Note (Signed)
Kevin Mahoney, Kevin Mahoney NO.:  [id number]  MEDICAL RECORD NO.:  [id number]  LOCATION:  [facility]                         FACILITY:  [facility]  PHYSICIAN:  Lodema Pilot, MD       DATE OF BIRTH:  1951-06-01  DATE OF PROCEDURE:  06/08/2012 DATE OF DISCHARGE:                              OPERATIVE REPORT   PROCEDURE:  Laparoscopic cholecystectomy with intraoperative cholangiogram.  PREOPERATIVE DIAGNOSIS:  Biliary dyskinesia.  POSTOPERATIVE DIAGNOSIS:  Biliary dyskinesia.  SURGEON:  Lodema Pilot, MD  ASSISTANT:  None.  ANESTHESIA:  General endotracheal anesthesia with 30 mL of 1% lidocaine with epinephrine and 0.25% Marcaine in a 50:50 mixture.  FLUIDS:  1500 mL of crystalloid.  ESTIMATED BLOOD LOSS:  Minimal.  DRAINS:  None.  SPECIMENS:  Gallbladder and contents sent to Pathology for permanent sectioning.  FINDINGS:  Normal-appearing gallbladder with no evidence of gallstones, normal cholangiogram.  INDICATION FOR PROCEDURE:  Kevin Mahoney is a 61 year old male with postprandial right upper quadrant pain and nausea and bloating and HIDA scan with a low ejection fraction consistent with biliary dyskinesia.  OPERATIVE DETAILS:  Kevin Mahoney was seen and evaluated in the preoperative area and risks and benefits of procedure were discussed in lay terms. Informed consent was obtained.  I again discussed with him the risk of possibility of persistent symptoms given the lack of gallstones and he expressed understanding and desired to proceed with cholecystectomy.  He was given prophylactic antibiotics and taken to the operating room, placed on the table in supine position, and general endotracheal anesthesia was obtained.  His abdomen was prepped and draped in a standard surgical fashion.  A supraumbilical midline incision was made in the skin and dissection carried down to the subcutaneous tissue using blunt dissection.  The abdominal wall fascia was elevated and  sharply incised and the peritoneum entered bluntly.  A 12-mm balloon port was placed under direct visualization and pneumoperitoneum was obtained. Laparoscope was introduced and there was no evidence of bowel injury upon entry.  An 11-mm epigastric trocar was placed and two 5 mm right upper quadrant trocars were placed under direct visualization and the gallbladder was retracted cephalad.  There was no obvious inflammatory change or edema.  There were some adhesions from the colon onto the gallbladder.  These were easily taken down bluntly and no cautery was used.  The peritoneum was taken down off the gallbladder using blunt dissection and the cystic artery and cystic duct were easily visualized in the usual anatomic position.  The artery and duct were skeletonized and a critical view of safety was obtained visualizing a single cystic duct and single cystic artery coursing onto the gallbladder with the liver parenchyma visible through the triangle of Calot.  Two clips were placed on the stay side of the artery and a single clip on the distal side, but was not divided at this time.  Clip was placed on the cystic duct and cystic ductotomy was made and the cholangiogram catheter was passed through the abdominal wall and placed in the cystic duct and clipped into place.  Cholangiogram was performed, which demonstrated small cystic duct and a small common bile duct,  but free flow of bile into the duodenum with normal right and left hepatic ducts and no evidence of any filling defects.  Cholangiogram catheter was removed and 2 clips were placed on the cystic duct stump and the duct was transected.  The artery which was already clipped was divided.  Then the gallbladder was removed from the gallbladder fossa using Bovie electrocautery and the gallbladder was not entered during the dissection, although there was some leakage of bile from the cystic duct region of the gallbladder, but there was  no leakage of any gallstones. The gallbladder was completely removed from the gallbladder fossa and removed from the umbilical trocar in an EndoCatch bag and passed off the table and sent to Pathology for permanent sectioning.  The gallbladder fossa was inspected for hemostasis which was obtained with Bovie electrocautery and was noted to be hemostatic.  Clips were appeared to be in good position.  The right upper quadrant trocars were removed under direct visualization.  There was a little bit of bleeding from the more medial 5 mm trocar site and this was controlled laparoscopically with some Bovie electrocautery and injection of epinephrine.  The abdominal wall was then noted to be hemostatic.  The right upper quadrant noted to be hemostatic.  There was no evidence of bleeding or bowel injury.  The umbilical fascia was approximated with interrupted 0 Vicryl sutures in open fashion and the sutures were secured and the abdomen was re-insufflated with carbon dioxide gas.  The abdominal wall closure was noted to be adequate without any evidence of bowel injury or bleeding.  The right upper quadrant and abdominal wall were noted to be hemostatic and the final trocar was removed.  The skin edges were injected with 30 mL of 1% lidocaine with epinephrine and 0.25% Marcaine in a 50:50 mixture and the skin edges were approximated with 4-0 Monocryl subcuticular suture to all skin incisions.  Skin was washed and dried.  Dermabond was applied.  All sponge, needle, and instrument counts were correct at the end of the case.  The patient tolerated the procedure well without apparent complications.          ______________________________ Lodema Pilot, MD     BL/MEDQ  D:  06/08/2012  T:  06/08/2012  Job:  [id number]

## 2012-06-12 ENCOUNTER — Encounter (HOSPITAL_COMMUNITY): Payer: Self-pay | Admitting: General Surgery

## 2012-06-28 ENCOUNTER — Encounter (INDEPENDENT_AMBULATORY_CARE_PROVIDER_SITE_OTHER): Payer: Self-pay | Admitting: General Surgery

## 2012-06-28 ENCOUNTER — Ambulatory Visit (INDEPENDENT_AMBULATORY_CARE_PROVIDER_SITE_OTHER): Payer: Managed Care, Other (non HMO) | Admitting: General Surgery

## 2012-06-28 ENCOUNTER — Encounter (INDEPENDENT_AMBULATORY_CARE_PROVIDER_SITE_OTHER): Payer: Managed Care, Other (non HMO) | Admitting: General Surgery

## 2012-06-28 VITALS — BP 124/80 | HR 71 | Temp 97.6°F | Resp 18 | Ht 68.0 in | Wt 192.0 lb

## 2012-06-28 DIAGNOSIS — Z4889 Encounter for other specified surgical aftercare: Secondary | ICD-10-CM

## 2012-06-28 DIAGNOSIS — Z5189 Encounter for other specified aftercare: Secondary | ICD-10-CM

## 2012-06-28 NOTE — Progress Notes (Signed)
Subjective:     Patient ID: Kevin Mahoney, male   DOB: 10/28/1950, 61 y.o.   MRN: [id number]  HPI This patient follows up status post left upper cholecystectomy for suspected biliary dyskinesia. He says that he feels much better after his procedure and that his preoperative nausea has now improved. He says his stomach feels better as well although he does continue to have heartburn and is taking Prilosec twice a day for this. He actually went all 36 hole this weekend and says that he did fine with that. His bowels are functioning normal. His pathology was benign. He has no complaints.  Review of Systems     Objective:   Physical Exam His abdomen is soft and nontender on exam his incisions are well-healed without signs of infection.    Assessment:     Status post laparoscopic cholecystectomy-doing well He seems to be doing better. He says that he feels better than before his procedure. His pathology was benign. He has pretty much return to full activities. He has no complaints other than some reflux. He says that this is well-controlled with his Prilosec.    Plan:     He can follow up with me on a when necessary basis and can increase his activity as tolerated.

## 2013-06-06 ENCOUNTER — Other Ambulatory Visit: Payer: Self-pay | Admitting: Dermatology

## 2014-07-15 ENCOUNTER — Other Ambulatory Visit: Payer: Self-pay | Admitting: Dermatology

## 2014-07-15 DIAGNOSIS — C4492 Squamous cell carcinoma of skin, unspecified: Secondary | ICD-10-CM

## 2014-07-15 HISTORY — DX: Squamous cell carcinoma of skin, unspecified: C44.92

## 2016-02-09 ENCOUNTER — Other Ambulatory Visit: Payer: Self-pay | Admitting: Dermatology

## 2017-04-04 DIAGNOSIS — K219 Gastro-esophageal reflux disease without esophagitis: Secondary | ICD-10-CM | POA: Diagnosis not present

## 2017-04-04 DIAGNOSIS — R7303 Prediabetes: Secondary | ICD-10-CM | POA: Diagnosis not present

## 2017-04-04 DIAGNOSIS — J329 Chronic sinusitis, unspecified: Secondary | ICD-10-CM | POA: Diagnosis not present

## 2017-04-04 DIAGNOSIS — R1011 Right upper quadrant pain: Secondary | ICD-10-CM | POA: Diagnosis not present

## 2017-05-16 DIAGNOSIS — R7303 Prediabetes: Secondary | ICD-10-CM | POA: Diagnosis not present

## 2017-05-16 DIAGNOSIS — J0110 Acute frontal sinusitis, unspecified: Secondary | ICD-10-CM | POA: Diagnosis not present

## 2017-05-16 DIAGNOSIS — R1011 Right upper quadrant pain: Secondary | ICD-10-CM | POA: Diagnosis not present

## 2017-05-16 DIAGNOSIS — E7800 Pure hypercholesterolemia, unspecified: Secondary | ICD-10-CM | POA: Diagnosis not present

## 2017-05-17 DIAGNOSIS — R69 Illness, unspecified: Secondary | ICD-10-CM | POA: Diagnosis not present

## 2017-05-20 DIAGNOSIS — R1013 Epigastric pain: Secondary | ICD-10-CM | POA: Diagnosis not present

## 2017-05-20 DIAGNOSIS — R1011 Right upper quadrant pain: Secondary | ICD-10-CM | POA: Diagnosis not present

## 2017-05-20 DIAGNOSIS — K219 Gastro-esophageal reflux disease without esophagitis: Secondary | ICD-10-CM | POA: Diagnosis not present

## 2017-05-23 DIAGNOSIS — K219 Gastro-esophageal reflux disease without esophagitis: Secondary | ICD-10-CM | POA: Diagnosis not present

## 2017-05-23 DIAGNOSIS — K317 Polyp of stomach and duodenum: Secondary | ICD-10-CM | POA: Diagnosis not present

## 2017-05-23 DIAGNOSIS — K2900 Acute gastritis without bleeding: Secondary | ICD-10-CM | POA: Diagnosis not present

## 2017-05-23 DIAGNOSIS — R1013 Epigastric pain: Secondary | ICD-10-CM | POA: Diagnosis not present

## 2017-05-23 DIAGNOSIS — R1011 Right upper quadrant pain: Secondary | ICD-10-CM | POA: Diagnosis not present

## 2017-05-23 DIAGNOSIS — K2930 Chronic superficial gastritis without bleeding: Secondary | ICD-10-CM | POA: Diagnosis not present

## 2017-06-01 DIAGNOSIS — D229 Melanocytic nevi, unspecified: Secondary | ICD-10-CM | POA: Diagnosis not present

## 2017-06-01 DIAGNOSIS — L821 Other seborrheic keratosis: Secondary | ICD-10-CM | POA: Diagnosis not present

## 2017-06-01 DIAGNOSIS — L570 Actinic keratosis: Secondary | ICD-10-CM | POA: Diagnosis not present

## 2017-06-01 DIAGNOSIS — K2930 Chronic superficial gastritis without bleeding: Secondary | ICD-10-CM | POA: Diagnosis not present

## 2017-06-09 DIAGNOSIS — E785 Hyperlipidemia, unspecified: Secondary | ICD-10-CM | POA: Diagnosis not present

## 2017-06-09 DIAGNOSIS — Z0000 Encounter for general adult medical examination without abnormal findings: Secondary | ICD-10-CM | POA: Diagnosis not present

## 2017-06-09 DIAGNOSIS — K219 Gastro-esophageal reflux disease without esophagitis: Secondary | ICD-10-CM | POA: Diagnosis not present

## 2017-06-09 DIAGNOSIS — Z6828 Body mass index (BMI) 28.0-28.9, adult: Secondary | ICD-10-CM | POA: Diagnosis not present

## 2017-06-09 DIAGNOSIS — M542 Cervicalgia: Secondary | ICD-10-CM | POA: Diagnosis not present

## 2017-08-04 DIAGNOSIS — J322 Chronic ethmoidal sinusitis: Secondary | ICD-10-CM | POA: Diagnosis not present

## 2017-08-04 DIAGNOSIS — J320 Chronic maxillary sinusitis: Secondary | ICD-10-CM | POA: Diagnosis not present

## 2017-08-04 DIAGNOSIS — J040 Acute laryngitis: Secondary | ICD-10-CM | POA: Diagnosis not present

## 2017-08-04 DIAGNOSIS — H6122 Impacted cerumen, left ear: Secondary | ICD-10-CM | POA: Diagnosis not present

## 2017-08-05 ENCOUNTER — Other Ambulatory Visit (HOSPITAL_COMMUNITY): Payer: Self-pay | Admitting: Otolaryngology

## 2017-08-05 DIAGNOSIS — J329 Chronic sinusitis, unspecified: Secondary | ICD-10-CM

## 2017-08-18 ENCOUNTER — Ambulatory Visit (HOSPITAL_COMMUNITY)
Admission: RE | Admit: 2017-08-18 | Discharge: 2017-08-18 | Disposition: A | Discharge status: Home / Self Care | Payer: Medicare HMO | Source: Ambulatory Visit | Attending: Otolaryngology | Admitting: Otolaryngology

## 2017-08-18 DIAGNOSIS — J320 Chronic maxillary sinusitis: Secondary | ICD-10-CM | POA: Diagnosis not present

## 2017-08-18 DIAGNOSIS — J329 Chronic sinusitis, unspecified: Secondary | ICD-10-CM

## 2017-08-23 DIAGNOSIS — J321 Chronic frontal sinusitis: Secondary | ICD-10-CM | POA: Diagnosis not present

## 2017-08-23 DIAGNOSIS — J320 Chronic maxillary sinusitis: Secondary | ICD-10-CM | POA: Diagnosis not present

## 2017-09-06 DIAGNOSIS — J323 Chronic sphenoidal sinusitis: Secondary | ICD-10-CM | POA: Diagnosis not present

## 2017-09-06 DIAGNOSIS — J322 Chronic ethmoidal sinusitis: Secondary | ICD-10-CM | POA: Diagnosis not present

## 2017-09-06 DIAGNOSIS — J301 Allergic rhinitis due to pollen: Secondary | ICD-10-CM | POA: Diagnosis not present

## 2017-09-06 DIAGNOSIS — J320 Chronic maxillary sinusitis: Secondary | ICD-10-CM | POA: Diagnosis not present

## 2017-09-21 DIAGNOSIS — L570 Actinic keratosis: Secondary | ICD-10-CM | POA: Diagnosis not present

## 2017-10-31 DIAGNOSIS — M7541 Impingement syndrome of right shoulder: Secondary | ICD-10-CM | POA: Diagnosis not present

## 2017-11-21 DIAGNOSIS — R7301 Impaired fasting glucose: Secondary | ICD-10-CM | POA: Diagnosis not present

## 2017-11-21 DIAGNOSIS — Z79899 Other long term (current) drug therapy: Secondary | ICD-10-CM | POA: Diagnosis not present

## 2017-11-21 DIAGNOSIS — R51 Headache: Secondary | ICD-10-CM | POA: Diagnosis not present

## 2017-11-21 DIAGNOSIS — Z23 Encounter for immunization: Secondary | ICD-10-CM | POA: Diagnosis not present

## 2017-11-21 DIAGNOSIS — E7800 Pure hypercholesterolemia, unspecified: Secondary | ICD-10-CM | POA: Diagnosis not present

## 2017-11-21 DIAGNOSIS — J349 Unspecified disorder of nose and nasal sinuses: Secondary | ICD-10-CM | POA: Diagnosis not present

## 2017-11-21 DIAGNOSIS — Z125 Encounter for screening for malignant neoplasm of prostate: Secondary | ICD-10-CM | POA: Diagnosis not present

## 2017-11-21 DIAGNOSIS — Z0000 Encounter for general adult medical examination without abnormal findings: Secondary | ICD-10-CM | POA: Diagnosis not present

## 2017-11-21 DIAGNOSIS — K219 Gastro-esophageal reflux disease without esophagitis: Secondary | ICD-10-CM | POA: Diagnosis not present

## 2017-11-22 ENCOUNTER — Other Ambulatory Visit: Payer: Self-pay | Admitting: Family Medicine

## 2017-11-22 DIAGNOSIS — G8929 Other chronic pain: Secondary | ICD-10-CM

## 2017-11-22 DIAGNOSIS — R51 Headache: Principal | ICD-10-CM

## 2017-11-22 DIAGNOSIS — R519 Headache, unspecified: Secondary | ICD-10-CM

## 2017-11-28 DIAGNOSIS — M7541 Impingement syndrome of right shoulder: Secondary | ICD-10-CM | POA: Diagnosis not present

## 2017-12-02 ENCOUNTER — Ambulatory Visit
Admission: RE | Admit: 2017-12-02 | Discharge: 2017-12-02 | Disposition: A | Discharge status: Home / Self Care | Payer: Medicare HMO | Source: Ambulatory Visit | Attending: Family Medicine | Admitting: Family Medicine

## 2017-12-02 DIAGNOSIS — R51 Headache: Principal | ICD-10-CM

## 2017-12-02 DIAGNOSIS — G8929 Other chronic pain: Secondary | ICD-10-CM

## 2017-12-21 DIAGNOSIS — J370 Chronic laryngitis: Secondary | ICD-10-CM | POA: Diagnosis not present

## 2017-12-21 DIAGNOSIS — J320 Chronic maxillary sinusitis: Secondary | ICD-10-CM | POA: Diagnosis not present

## 2017-12-21 DIAGNOSIS — J343 Hypertrophy of nasal turbinates: Secondary | ICD-10-CM | POA: Diagnosis not present

## 2017-12-21 DIAGNOSIS — J330 Polyp of nasal cavity: Secondary | ICD-10-CM | POA: Diagnosis not present

## 2017-12-21 DIAGNOSIS — J322 Chronic ethmoidal sinusitis: Secondary | ICD-10-CM | POA: Diagnosis not present

## 2017-12-26 DIAGNOSIS — M7541 Impingement syndrome of right shoulder: Secondary | ICD-10-CM | POA: Diagnosis not present

## 2017-12-30 ENCOUNTER — Other Ambulatory Visit: Payer: Self-pay | Admitting: Otolaryngology

## 2017-12-30 DIAGNOSIS — Z008 Encounter for other general examination: Secondary | ICD-10-CM | POA: Diagnosis not present

## 2017-12-30 DIAGNOSIS — J3489 Other specified disorders of nose and nasal sinuses: Secondary | ICD-10-CM | POA: Diagnosis not present

## 2017-12-30 DIAGNOSIS — J322 Chronic ethmoidal sinusitis: Secondary | ICD-10-CM | POA: Diagnosis not present

## 2017-12-30 DIAGNOSIS — J343 Hypertrophy of nasal turbinates: Secondary | ICD-10-CM | POA: Diagnosis not present

## 2017-12-30 DIAGNOSIS — J338 Other polyp of sinus: Secondary | ICD-10-CM | POA: Diagnosis not present

## 2017-12-30 DIAGNOSIS — J320 Chronic maxillary sinusitis: Secondary | ICD-10-CM | POA: Diagnosis not present

## 2017-12-30 DIAGNOSIS — J330 Polyp of nasal cavity: Secondary | ICD-10-CM | POA: Diagnosis not present

## 2018-01-05 DIAGNOSIS — R972 Elevated prostate specific antigen [PSA]: Secondary | ICD-10-CM | POA: Diagnosis not present

## 2018-01-05 DIAGNOSIS — N400 Enlarged prostate without lower urinary tract symptoms: Secondary | ICD-10-CM | POA: Diagnosis not present

## 2018-03-14 DIAGNOSIS — H5711 Ocular pain, right eye: Secondary | ICD-10-CM | POA: Diagnosis not present

## 2018-05-23 DIAGNOSIS — R69 Illness, unspecified: Secondary | ICD-10-CM | POA: Diagnosis not present

## 2018-06-13 ENCOUNTER — Other Ambulatory Visit: Payer: Self-pay | Admitting: Dermatology

## 2018-06-13 DIAGNOSIS — D485 Neoplasm of uncertain behavior of skin: Secondary | ICD-10-CM | POA: Diagnosis not present

## 2018-06-13 DIAGNOSIS — L570 Actinic keratosis: Secondary | ICD-10-CM | POA: Diagnosis not present

## 2018-06-13 DIAGNOSIS — D229 Melanocytic nevi, unspecified: Secondary | ICD-10-CM | POA: Diagnosis not present

## 2018-06-13 DIAGNOSIS — L820 Inflamed seborrheic keratosis: Secondary | ICD-10-CM | POA: Diagnosis not present

## 2018-06-28 DIAGNOSIS — R972 Elevated prostate specific antigen [PSA]: Secondary | ICD-10-CM | POA: Diagnosis not present

## 2018-07-06 DIAGNOSIS — N400 Enlarged prostate without lower urinary tract symptoms: Secondary | ICD-10-CM | POA: Diagnosis not present

## 2018-07-06 DIAGNOSIS — R972 Elevated prostate specific antigen [PSA]: Secondary | ICD-10-CM | POA: Diagnosis not present

## 2020-03-23 IMAGING — MR MR HEAD W/O CM
10 series · 48 of 48 positions shown · non-contrast
Comparison: CT maxillofacial 08/18/2017

CLINICAL DATA: Right-sided headaches centered behind the right
orbit. Blurred vision. Symptoms are chronic.

EXAM:
MRI HEAD WITHOUT CONTRAST
TECHNIQUE: Multiplanar, multiecho pulse sequences of the brain and surrounding
structures were obtained without intravenous contrast.

[Series 5: T1 · sagittal · 4.0mm · 0.75mm/px · 1 of 31 slices shown (1 of 2)]
[im 1/31]
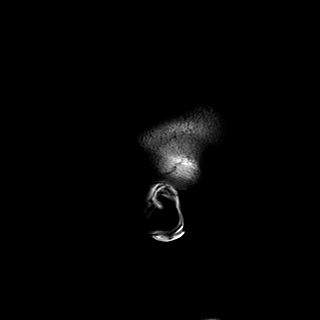

[Series 6: DWI · axial · 3.0mm · 1.44mm/px · z∈[-40,+101]mm · 7 of 90 slices shown (1 of 4)]
[im 1/90]
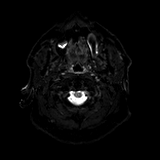
[im 15/90]
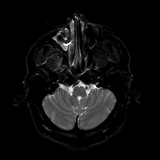
[im 30/90]
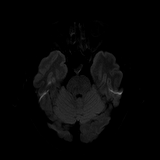
[im 45/90]
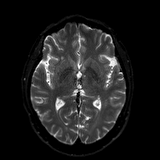
[im 60/90]
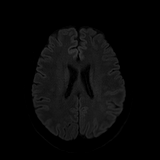
[im 75/90]
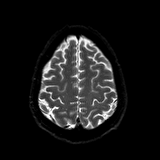
[im 90/90]
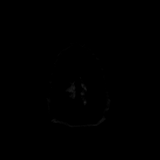

[Series 7: DWI · axial · 3.0mm · 1.44mm/px · z∈[-40,+101]mm · 4 of 45 slices shown (2 of 4)]
[im 1/45]
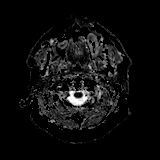
[im 15/45]
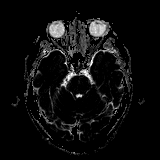
[im 30/45]
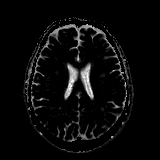
[im 45/45]
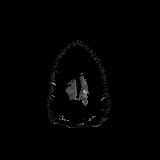

[Series 8: DWI · coronal · 5.0mm · 1.44mm/px · 5 of 66 slices shown (3 of 4)]
[im 1/66]
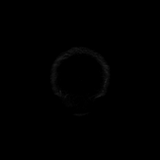
[im 17/66]
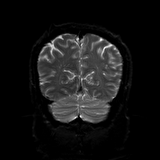
[im 33/66]
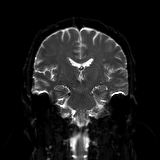
[im 49/66]
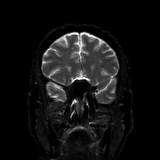
[im 66/66]
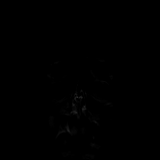

[Series 9: DWI · coronal · 5.0mm · 1.44mm/px · 3 of 33 slices shown (4 of 4)]
[im 1/33]
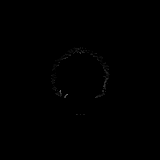
[im 17/33]
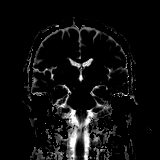
[im 33/33]
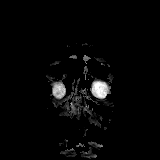

[Series 10: T2 · axial · 4.0mm · 0.36mm/px · z∈[-41,+101]mm · 2 of 29 slices shown (1 of 2)]
[im 1/29]
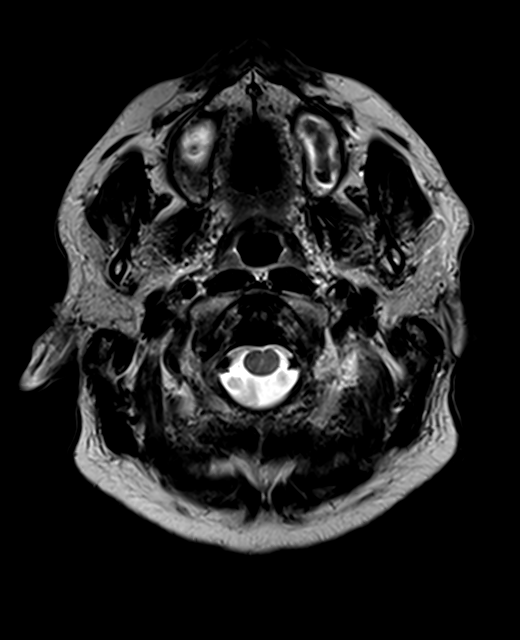
[im 29/29]
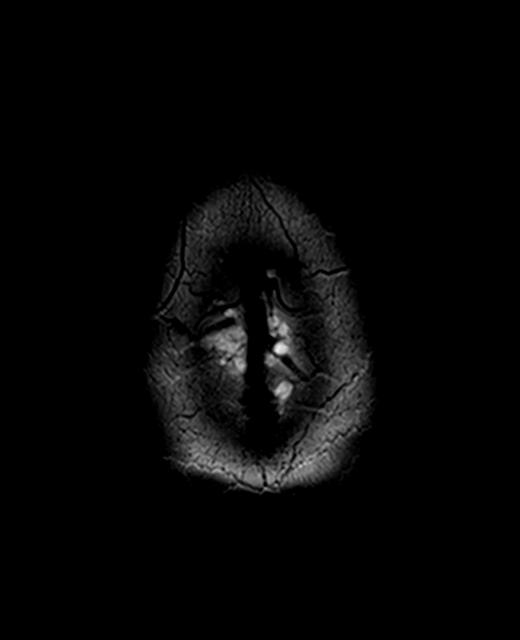

[Series 11: FLAIR · axial · 3.0mm · 0.72mm/px · z∈[-43,+104]mm · 2 of 26 slices shown]
[im 1/26]
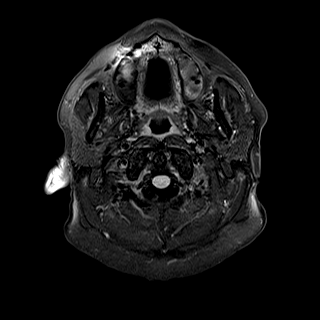
[im 26/26]
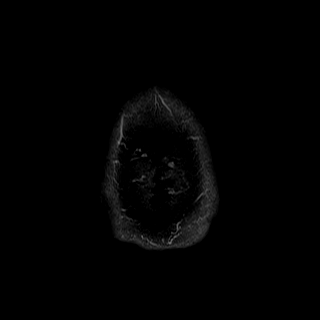

[Series 13: swi_images · axial · 1.5mm · 0.90mm/px · z∈[-40,+99]mm · 8 of 96 slices shown]
[im 1/96]
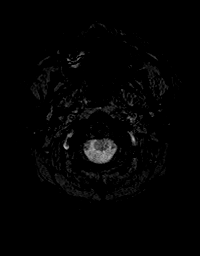
[im 14/96]
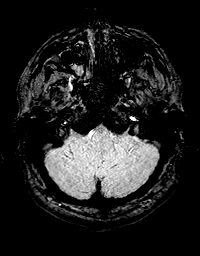
[im 28/96]
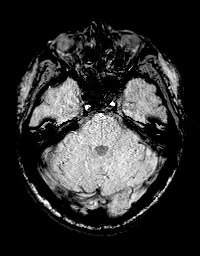
[im 41/96]
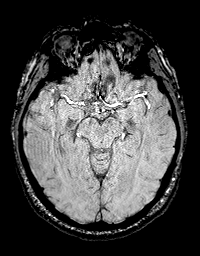
[im 55/96]
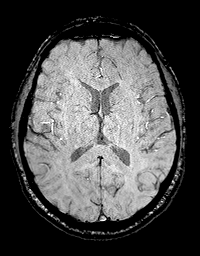
[im 68/96]
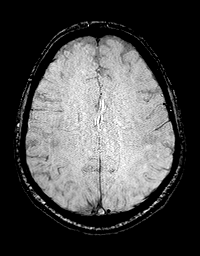
[im 82/96]
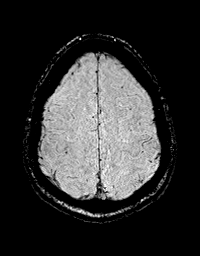
[im 96/96]
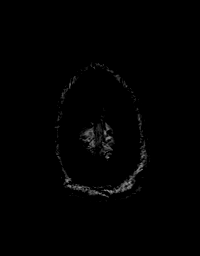

[Series 14: T1 · axial · 1.0mm · 0.90mm/px · z∈[-50,+105]mm · 13 of 160 slices shown (2 of 2)]
[im 1/160]
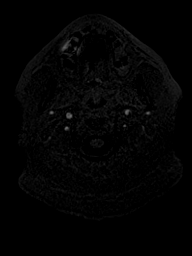
[im 14/160]
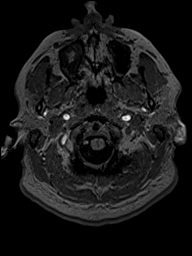
[im 27/160]
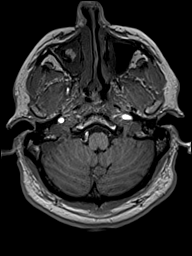
[im 40/160]
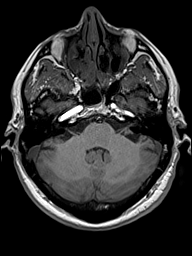
[im 54/160]
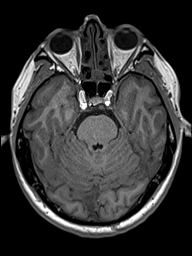
[im 67/160]
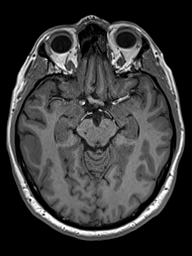
[im 80/160]
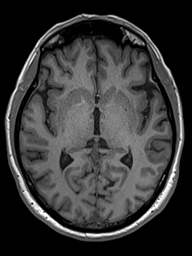
[im 93/160]
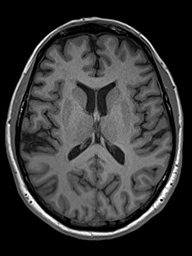
[im 107/160]
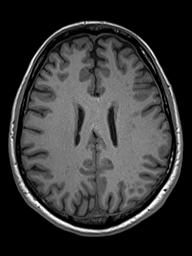
[im 120/160]
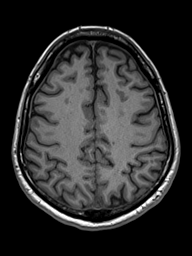
[im 133/160]
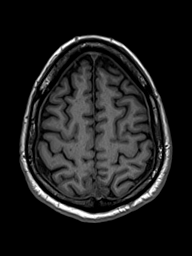
[im 146/160]
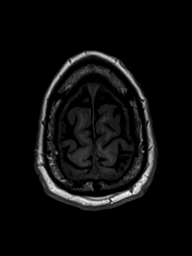
[im 160/160]
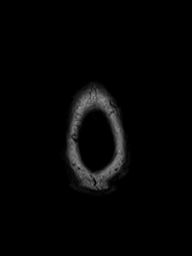

[Series 15: T2 · coronal · 4.5mm · 0.36mm/px · 3 of 32 slices shown (2 of 2)]
[im 1/32]
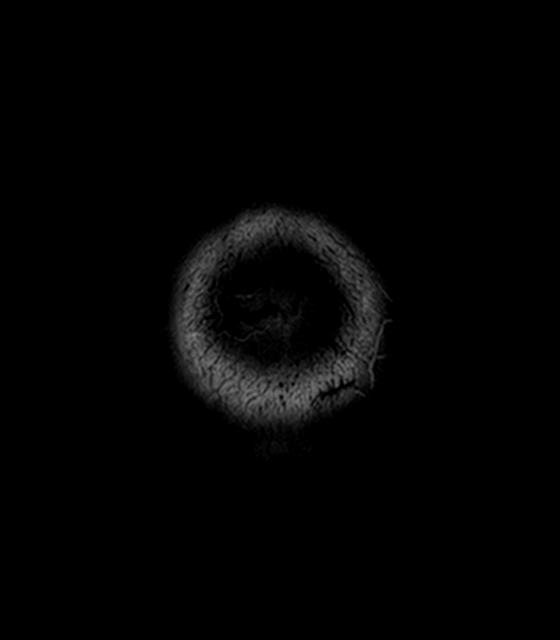
[im 16/32]
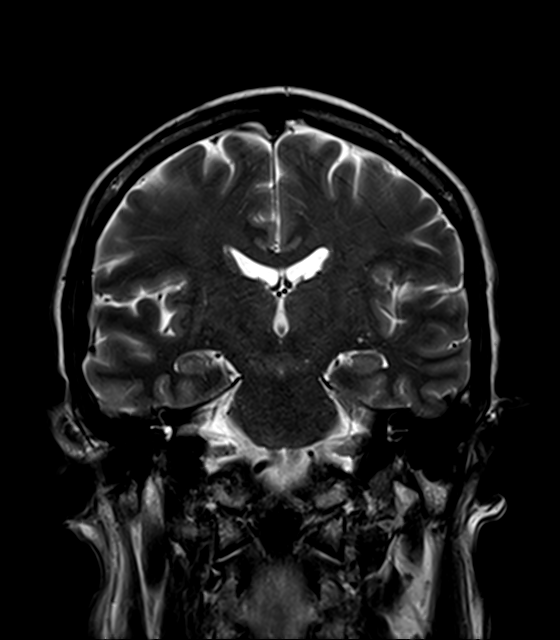
[im 32/32]
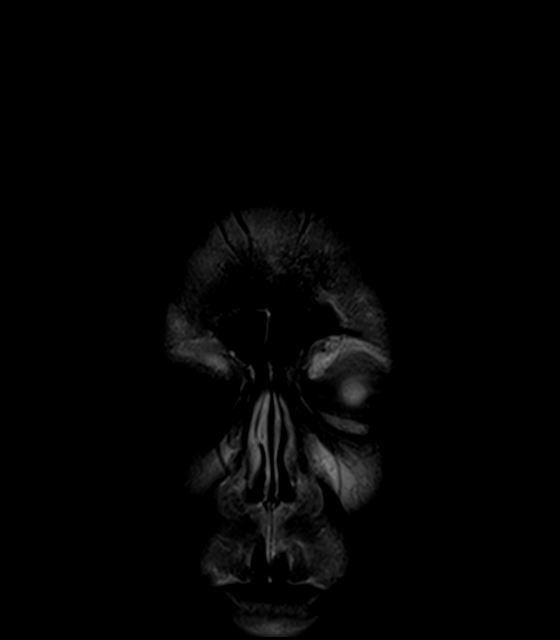

[48 of 48 positions shown; findings below may reference images not displayed]

FINDINGS: Brain: No acute infarct, hemorrhage, or mass lesion is present. The
ventricles are of normal size.. Periventricular and subcortical T2
hyperintensities bilaterally are mildly advanced for age. These are
slightly worse on the left. Internal auditory canals are within
normal limits bilaterally. The brainstem and cerebellum are normal.

Vascular: Flow is present in the major intracranial arteries.

Skull and upper cervical spine: The skull base is within normal
limits. The craniocervical junction is normal. The upper cervical
spine is within normal limits.

Sinuses/Orbits: The ethmoid air cells are near completely opacified
bilaterally. Mucosal thickening is present in the frontal sinuses.
Circumferential mucosal disease is present in the maxillary sinuses
bilaterally. There is a fluid level right. A small fluid level is
present in the right sphenoid sinus. Left sphenoid sinus is clear.
The mastoid air cells are clear. Globes and orbits are within normal
limits. The optic chiasm is unremarkable.
IMPRESSION: 1. No acute or focal abnormality of the brain to explain patient's
headaches.
2. Periventricular white matter changes are mildly advanced for age.
The finding is nonspecific but can be seen in the setting of chronic
microvascular ischemia, a demyelinating process such as multiple
sclerosis, vasculitis, complicated migraine headaches, or as the
sequelae of a prior infectious or inflammatory process.
3. Extensive sinus disease without evidence for acute and chronic
disease. This may contribute to the patient's headaches.
4. No other focal orbital abnormality.

## 2020-11-19 ENCOUNTER — Other Ambulatory Visit: Payer: Self-pay

## 2020-11-19 ENCOUNTER — Encounter: Payer: Self-pay | Admitting: Dermatology

## 2020-11-19 ENCOUNTER — Ambulatory Visit (INDEPENDENT_AMBULATORY_CARE_PROVIDER_SITE_OTHER): Payer: Medicare Other | Admitting: Dermatology

## 2020-11-19 DIAGNOSIS — D1801 Hemangioma of skin and subcutaneous tissue: Secondary | ICD-10-CM | POA: Diagnosis not present

## 2020-11-19 DIAGNOSIS — L821 Other seborrheic keratosis: Secondary | ICD-10-CM | POA: Diagnosis not present

## 2020-11-19 DIAGNOSIS — L570 Actinic keratosis: Secondary | ICD-10-CM | POA: Diagnosis not present

## 2020-11-19 DIAGNOSIS — Z1283 Encounter for screening for malignant neoplasm of skin: Secondary | ICD-10-CM

## 2020-11-19 DIAGNOSIS — Z86007 Personal history of in-situ neoplasm of skin: Secondary | ICD-10-CM

## 2020-11-19 DIAGNOSIS — D0461 Carcinoma in situ of skin of right upper limb, including shoulder: Secondary | ICD-10-CM

## 2020-11-19 DIAGNOSIS — L851 Acquired keratosis [keratoderma] palmaris et plantaris: Secondary | ICD-10-CM

## 2020-11-19 DIAGNOSIS — C44622 Squamous cell carcinoma of skin of right upper limb, including shoulder: Secondary | ICD-10-CM

## 2020-11-19 DIAGNOSIS — D485 Neoplasm of uncertain behavior of skin: Secondary | ICD-10-CM

## 2020-11-19 NOTE — Patient Instructions (Signed)

## 2020-12-01 ENCOUNTER — Encounter: Payer: Self-pay | Admitting: Dermatology

## 2020-12-01 NOTE — Progress Notes (Signed)
Follow-Up Visit   Subjective  Kevin Mahoney is a 70 y.o. male who presents for the following: Annual Exam (Here for full body skin examination- no new concerns).  General skin examination, growing spot right arm Location:  Duration:  Quality:  Associated Signs/Symptoms: Modifying Factors:  Severity:  Timing: Context:   Objective  Well appearing patient in no apparent distress; mood and affect are within normal limits. Objective  Left Forearm - Anterior, Right Forearm - Anterior: No sign recurrence  Objective  Scalp: Full body skin check.  No atypical pigmented lesions.  1 possible new nonmelanoma skin cancer arm biopsied and treated.  Objective  Mid Back: Multiple 2 to 6 mm flattopped brown textured papules  Objective  Left Lower Back: 1 to 2 mm smooth red papules  Objective  Left Lower Leg - Posterior: Subtly raised textured tan 5 mm papule  Objective  Left Concha, Left Forearm - Posterior (2), Right Inferior Crus of Antihelix (2): Small diffuse actinic keratoses, so instead of freezing he may call me in ithe fall ok per Dr. Denna Haggard ok to do phone visit for [place] .4%  Objective  Right Forearm - Posterior: Thick 2 mm right horn sitting atop 1 cm pink waxy scale; suspect SCCA.  After shave biopsy the base was treated with curettage plus cautery       A full examination was performed including scalp, head, eyes, ears, nose, lips, neck, chest, axillae, abdomen, back, buttocks, bilateral upper extremities, bilateral lower extremities, hands, feet, fingers, toes, fingernails, and toenails. All findings within normal limits unless otherwise noted below.   Assessment & Plan    History of squamous cell carcinoma in situ (SCCIS) (2) Left Forearm - Anterior; Right Forearm - Anterior  Yearly skin check.  Screening exam for skin cancer Scalp  Yearly skin check.  Encouraged to self examine twice annually.  Seborrheic keratosis Mid Back  May leave if  stable.  Cherry angioma Left Lower Back  No intervention necessary  Stucco keratoses Left Lower Leg - Posterior  No intervention necessary  AK (actinic keratosis) (5) Left Forearm - Posterior (2); Right Inferior Crus of Antihelix (2); Left Concha  Destruction of lesion - Left Concha, Left Forearm - Posterior, Right Inferior Crus of Antihelix Complexity: simple   Destruction method: cryotherapy   Informed consent: discussed and consent obtained   Timeout:  patient name, date of birth, surgical site, and procedure verified Lesion destroyed using liquid nitrogen: Yes   Cryotherapy cycles:  5 Outcome: patient tolerated procedure well with no complications   Post-procedure details: wound care instructions given    Squamous cell carcinoma of skin of right upper limb, including shoulder Right Forearm - Posterior  Skin / nail biopsy Type of biopsy: tangential   Informed consent: discussed and consent obtained   Timeout: patient name, date of birth, surgical site, and procedure verified   Procedure prep:  Patient was prepped and draped in usual sterile fashion (Non sterile) Prep type:  Chlorhexidine Anesthesia: the lesion was anesthetized in a standard fashion   Anesthetic:  1% lidocaine w/ epinephrine 1-100,000 local infiltration Instrument used: flexible razor blade   Outcome: patient tolerated procedure well   Post-procedure details: wound care instructions given    Destruction of lesion Complexity: simple   Destruction method: electrodesiccation and curettage   Informed consent: discussed and consent obtained   Timeout:  patient name, date of birth, surgical site, and procedure verified Anesthesia: the lesion was anesthetized in a standard fashion   Anesthetic:  1% lidocaine w/ epinephrine 1-100,000 local infiltration Curettage performed in three different directions: Yes   Electrodesiccation performed over the curetted area: Yes   Curettage cycles:  3 Lesion length (cm):   1.5 Lesion width (cm):  1.5 Margin per side (cm):  0 Final wound size (cm):  1.5 Hemostasis achieved with:  aluminum chloride Outcome: patient tolerated procedure well with no complications   Post-procedure details: wound care instructions given    Specimen 1 - Surgical pathology Differential Diagnosis: bcc vs scc Curetxx1 cautery  Check Margins: No      I, Lavonna Monarch, MD, have reviewed all documentation for this visit.  The documentation on 12/01/20 for the exam, diagnosis, procedures, and orders are all accurate and complete.

## 2021-01-13 ENCOUNTER — Ambulatory Visit: Payer: Medicare HMO | Admitting: Dermatology

## 2021-12-22 ENCOUNTER — Ambulatory Visit (INDEPENDENT_AMBULATORY_CARE_PROVIDER_SITE_OTHER): Payer: Medicare (Managed Care) | Admitting: Dermatology

## 2021-12-22 ENCOUNTER — Encounter: Payer: Self-pay | Admitting: Dermatology

## 2021-12-22 DIAGNOSIS — L851 Acquired keratosis [keratoderma] palmaris et plantaris: Secondary | ICD-10-CM | POA: Diagnosis not present

## 2021-12-22 DIAGNOSIS — D485 Neoplasm of uncertain behavior of skin: Secondary | ICD-10-CM

## 2021-12-22 DIAGNOSIS — Z1283 Encounter for screening for malignant neoplasm of skin: Secondary | ICD-10-CM

## 2021-12-22 DIAGNOSIS — L820 Inflamed seborrheic keratosis: Secondary | ICD-10-CM | POA: Diagnosis not present

## 2021-12-22 DIAGNOSIS — Z85828 Personal history of other malignant neoplasm of skin: Secondary | ICD-10-CM

## 2021-12-22 DIAGNOSIS — L570 Actinic keratosis: Secondary | ICD-10-CM

## 2021-12-22 NOTE — Patient Instructions (Signed)

## 2021-12-23 ENCOUNTER — Encounter: Payer: Self-pay | Admitting: Dermatology

## 2021-12-23 NOTE — Progress Notes (Signed)
? ?Follow-Up Visit ?  ?Subjective  ?Kevin Mahoney is a 71 y.o. male who presents for the following: Annual Exam (Patient here today for yearly skin check per patient he has a lesion on right lower lip x 3 months no bleeding, no pain. Per patient he also has a lesion on his right chest x years that's changing colors. Personal history of non mole skin cancer. No family history of atypical moles, melanoma or non mole skin cancer. ). ? ?Change in pigmented spot right chest, general skin examination. ?Location:  ?Duration:  ?Quality:  ?Associated Signs/Symptoms: ?Modifying Factors:  ?Severity:  ?Timing: ?Context:  ? ?Objective  ?Well appearing patient in no apparent distress; mood and affect are within normal limits. ?V of Neck ?Verrucous 69-year-old white crust ? ? ? ? ? ? ?Right Side Breast ?Bi-chromic 1.1 cm crust with history of change in size and color ? ? ? ? ? ? ?Right Lower Vermilion Lip, Right Superior Helix ?Lip lesion is just outside mucosa, 2 mm tender hornlike.  Actinic keratosis versus small ectopic mucocele.  If freezing fails he should consult an oral surgeon for possible removal. ? ?Left Lower Leg - Anterior, Left Lower Leg - Posterior, Right Lower Leg - Anterior, Right Lower Leg - Posterior ?Half dozen scattered mostly truncal brown 5 to 8 mm flattopped textured papules with typical dermoscopy of seborrheic keratosis.  3-4 dozen 3 to 4 mm white verrucous keratoses on lower legs. ? ? ? ?A full examination was performed including scalp, head, eyes, ears, nose, lips, neck, chest, axillae, abdomen, back, buttocks, bilateral upper extremities, bilateral lower extremities, hands, feet, fingers, toes, fingernails, and toenails. All findings within normal limits unless otherwise noted below.  Area beneath undergarment not fully examined. ? ? ?Assessment & Plan  ? ? ?Neoplasm of uncertain behavior of skin (2) ?V of Neck ? ?Skin / nail biopsy ?Type of biopsy: tangential   ?Informed consent: discussed and consent  obtained   ?Timeout: patient name, date of birth, surgical site, and procedure verified   ?Procedure prep:  Patient was prepped and draped in usual sterile fashion (Non sterile) ?Prep type:  Chlorhexidine ?Anesthesia: the lesion was anesthetized in a standard fashion   ?Anesthetic:  1% lidocaine w/ epinephrine 1-100,000 local infiltration ?Instrument used: flexible razor blade   ?Hemostasis achieved with: ferric subsulfate   ?Outcome: patient tolerated procedure well   ?Post-procedure details: sterile dressing applied and wound care instructions given   ?Dressing type: bandage and petrolatum   ? ?Specimen 1 - Surgical pathology ?Differential Diagnosis: R/O BCC vs SCC vs Wart ? ?Check Margins: No ? ?Right Side Breast ? ?Skin / nail biopsy ?Type of biopsy: tangential   ?Informed consent: discussed and consent obtained   ?Timeout: patient name, date of birth, surgical site, and procedure verified   ?Procedure prep:  Patient was prepped and draped in usual sterile fashion (Non sterile) ?Prep type:  Chlorhexidine ?Anesthesia: the lesion was anesthetized in a standard fashion   ?Anesthetic:  1% lidocaine w/ epinephrine 1-100,000 local infiltration ?Instrument used: flexible razor blade   ?Hemostasis achieved with: ferric subsulfate   ?Outcome: patient tolerated procedure well   ?Post-procedure details: sterile dressing applied and wound care instructions given   ?Dressing type: bandage and petrolatum   ? ?Specimen 2 - Surgical pathology ?Differential Diagnosis: R/O Atypia vs ISK ? ?Check Margins: No ? ?AK (actinic keratosis) (2) ?Right Superior Helix; Right Lower Vermilion Lip ? ?Destruction of lesion - Right Lower Vermilion Lip, Right Superior Helix ?Complexity:  simple   ?Destruction method: cryotherapy   ?Informed consent: discussed and consent obtained   ?Timeout:  patient name, date of birth, surgical site, and procedure verified ?Lesion destroyed using liquid nitrogen: Yes   ?Cryotherapy cycles:  4 ?Outcome: patient  tolerated procedure well with no complications   ?Post-procedure details: wound care instructions given   ? ?Acrokeratosis (4) ?Left Lower Leg - Anterior; Right Lower Leg - Anterior; Left Lower Leg - Posterior; Right Lower Leg - Posterior ? ?Leave if stable ? ? ? ? ? ?I, Lavonna Monarch, MD, have reviewed all documentation for this visit.  The documentation on 12/23/21 for the exam, diagnosis, procedures, and orders are all accurate and complete. ?

## 2022-12-27 ENCOUNTER — Ambulatory Visit: Payer: Medicare (Managed Care) | Admitting: Dermatology

## 2023-04-21 ENCOUNTER — Encounter: Payer: Self-pay | Admitting: Urology

## 2023-04-21 ENCOUNTER — Ambulatory Visit (INDEPENDENT_AMBULATORY_CARE_PROVIDER_SITE_OTHER): Payer: Medicare HMO | Admitting: Urology

## 2023-04-21 VITALS — BP 126/77 | HR 69 | Ht 68.0 in | Wt 180.0 lb

## 2023-04-21 DIAGNOSIS — N401 Enlarged prostate with lower urinary tract symptoms: Secondary | ICD-10-CM

## 2023-04-21 DIAGNOSIS — R972 Elevated prostate specific antigen [PSA]: Secondary | ICD-10-CM | POA: Diagnosis not present

## 2023-04-21 MED ORDER — TAMSULOSIN HCL 0.4 MG PO CAPS: 0.4000 mg | ORAL_CAPSULE | Freq: Every day | ORAL | 3 refills | Status: AC

## 2023-04-21 NOTE — Progress Notes (Signed)
Assessment: 1. Elevated PSA   2. Benign localized prostatic hyperplasia with lower urinary tract symptoms (LUTS)      Plan: Today I had a long discussion with the patient regarding his urologic issues. Concerning his BPH/LUTS we discussed options for medical management.  Following our discussion he would like to begin new medical therapy. Rx: Tamsulosin 0.4 mg daily Nature of medication including proper utilization as well as potential adverse events and side effects reviewed.  Today also discussed with him his continued rising PSA in the setting of negative prior biopsy.  At this time I have recommended proceeding with a multiparametric prostate MRI.  If this shows any worrisome high-grade lesions we would then use that information for targeting purposes.  If his MRI does not show any worrisome high-grade lesions I would be inclined to repeat his PSA in a few months to see if it falls with medical management of his lower urinary tract symptoms and consider a 5 alpha reductase inhibitor if that is the case.  Follow-up after MRI. DRE on follow-up  Chief Complaint: elevated psa  History of Present Illness:  Kevin Mahoney is a 72 y.o. male who is seen in consultation from Eartha Inch, MD for evaluation of elevated psa.  Patient has previously been seen by [facility]. I have reviewed those extensive records which are summarized below.  No fam hx of CaP.  Patient does report progressive worsening LUTS.  No prior med mgmt. IPSS= 23/3  PSA // Prostate Data---- 09/2018  2.9 11/2019  2.6 12/2020  4.5 01/2022  6.8 (20% free)   TRUS/BX neg for tumor.  No asap or pin.  Volume= 45ml.  DRE Nl  03/2022  4.5 03/2023  9.0          Past Medical History:  Past Medical History:  Diagnosis Date   Anemia    h/o 3 yrs. ago, used fe    Cancer (HCC)    Squamous, head, basal- behind ear    Disorder of heart rhythm    "funny little beat"- last ekg- > one yr., ago [place] , [place] Dr.  Whitney Muse-  ago, in [place], [place]   GERD (gastroesophageal reflux disease)    H/O exercise stress test    wnl, done due to irreg. heartrate, 10 yrs. ago in [place], [place]   [place]    rare migraines    Hyperlipidemia    Hypertension    history- off meds x4 months    Sinus infection    pt. reports that he has sinus infection currently, states that he will be started on antibiotic today    Squamous cell carcinoma of skin 07/15/2014   Left forearm(tx after bx)   Squamous cell carcinoma of skin 02/09/2016   Right forearm(tx after bx)    Past Surgical History:  Past Surgical History:  Procedure Laterality Date   CHOLECYSTECTOMY  06/08/2012   Procedure: LAPAROSCOPIC CHOLECYSTECTOMY WITH INTRAOPERATIVE CHOLANGIOGRAM;  Surgeon: Lodema Pilot, DO;  Location: [facility] OR;  Service: General;  Laterality: N/A;   ELBOW SURGERY     left- ulnar nerve reposition     NASAL SINUS SURGERY     R side- for fungus infection - 2010   TONSILLECTOMY     as a youngster    Allergies:  No Known Allergies  Family History:  Family History  Problem Relation Age of Onset   Cancer Father        lung   Cancer Maternal Grandmother  unknown - possibly bone    Social History:  Social History   Tobacco Use   Smoking status: Never   Smokeless tobacco: Never  Vaping Use   Vaping status: Never Used  Substance Use Topics   Alcohol use: No   Drug use: No    Review of symptoms:  Constitutional:  Negative for unexplained weight loss, night sweats, fever, chills ENT:  Negative for nose bleeds, sinus pain, painful swallowing CV:  Negative for chest pain, shortness of breath, exercise intolerance, palpitations, loss of consciousness Resp:  Negative for cough, wheezing, shortness of breath GI:  Negative for nausea, vomiting, diarrhea, bloody stools GU:  Positives noted in HPI; otherwise negative for gross hematuria, dysuria, urinary incontinence Neuro:  Negative for seizures, poor balance, limb  weakness, slurred speech Psych:  Negative for lack of energy, depression, anxiety Endocrine:  Negative for polydipsia, polyuria, symptoms of hypoglycemia (dizziness, hunger, sweating) Hematologic:  Negative for anemia, purpura, petechia, prolonged or excessive bleeding, use of anticoagulants  Allergic:  Negative for difficulty breathing or choking as a result of exposure to anything; no shellfish allergy; no allergic response (rash/itch) to materials, foods  Physical exam: BP 126/77   Pulse 69   Ht 5\' 8"  (1.727 m)   Wt 180 lb (81.6 kg)   BMI 27.37 kg/m  GENERAL APPEARANCE:  Well appearing, well developed, well nourished, NAD   Results: UA neg

## 2023-04-25 LAB — URINALYSIS, ROUTINE W REFLEX MICROSCOPIC
Bilirubin, UA: NEGATIVE
Glucose, UA: NEGATIVE
Ketones, UA: NEGATIVE
Leukocytes,UA: NEGATIVE
Nitrite, UA: NEGATIVE
Protein,UA: NEGATIVE
RBC, UA: NEGATIVE
Specific Gravity, UA: 1.015 (ref 1.005–1.030)
Urobilinogen, Ur: 0.2 mg/dL (ref 0.2–1.0)
pH, UA: 6 (ref 5.0–7.5)

## 2023-05-11 ENCOUNTER — Encounter: Payer: Self-pay | Admitting: Urology

## 2023-05-17 ENCOUNTER — Ambulatory Visit
Admission: RE | Admit: 2023-05-17 | Discharge: 2023-05-17 | Disposition: A | Discharge status: Home / Self Care | Payer: Medicare HMO | Source: Ambulatory Visit | Attending: Urology | Admitting: Urology

## 2023-05-17 DIAGNOSIS — R972 Elevated prostate specific antigen [PSA]: Secondary | ICD-10-CM

## 2023-05-17 MED ORDER — GADOPICLENOL 0.5 MMOL/ML IV SOLN
8.0000 mL | Freq: Once | INTRAVENOUS | Status: AC | PRN
  Administered 2023-05-17: 8 mL via INTRAVENOUS

## 2023-05-19 ENCOUNTER — Ambulatory Visit: Payer: Medicare HMO | Admitting: Urology

## 2023-05-24 ENCOUNTER — Ambulatory Visit: Payer: Medicare HMO | Admitting: Urology

## 2023-05-24 ENCOUNTER — Encounter: Payer: Self-pay | Admitting: Urology

## 2023-05-24 VITALS — BP 127/82 | HR 65 | Ht 68.0 in | Wt 180.0 lb

## 2023-05-24 DIAGNOSIS — N401 Enlarged prostate with lower urinary tract symptoms: Secondary | ICD-10-CM

## 2023-05-24 DIAGNOSIS — R972 Elevated prostate specific antigen [PSA]: Secondary | ICD-10-CM | POA: Diagnosis not present

## 2023-05-24 LAB — URINALYSIS, ROUTINE W REFLEX MICROSCOPIC
Bilirubin, UA: NEGATIVE
Glucose, UA: NEGATIVE
Leukocytes,UA: NEGATIVE
Nitrite, UA: NEGATIVE
Protein,UA: NEGATIVE
RBC, UA: NEGATIVE
Specific Gravity, UA: 1.025 (ref 1.005–1.030)
Urobilinogen, Ur: 0.2 mg/dL (ref 0.2–1.0)
pH, UA: 6 (ref 5.0–7.5)

## 2023-05-24 MED ORDER — FINASTERIDE 5 MG PO TABS
5.0000 mg | ORAL_TABLET | Freq: Every day | ORAL | 3 refills | Status: AC
Start: 2023-05-24 — End: ?

## 2023-05-24 NOTE — Progress Notes (Signed)
Assessment: 1. Elevated PSA   2. Benign localized prostatic hyperplasia with lower urinary tract symptoms (LUTS)     Plan: Today I had a long discussion with the patient regarding his elevated PSA and BPH/lower urinary tract symptoms.  Given his prior negative biopsy and negative MRI we have elected not to pursue repeat biopsy.  Concerning his lower urinary tract symptoms we have elected to begin combination medical therapy. Patient will continue tamsulosin 0.4 mg daily In addition we will begin finasteride 5 mg daily Rationale as well as nature of medication including potential adverse events and side effects reviewed.  Patient will follow-up in approximately 6 months (he spends the winters in [place]) with PSA prior to visit  Chief Complaint: Luts/elevated psa  HPI: Kevin Mahoney is a 72 y.o. male who presents for continued evaluation of bph/luts and elevated psa. Please see my note 04/21/2023 at the time of initial visit for detailed history and exam which is summarized below. Patient has had progressive lower urinary tract symptoms without prior medical management.   He was started on tamsulosin 04/2023. He reports that this has helped somewhat in the short.  He has been on it.  Current IPSS = 17  Baseline IPSS = 23  No fam hx of CaP.     PSA // Prostate Data---- 09/2018             2.9 11/2019             2.6 12/2020             4.5 01/2022             6.8 (20% free)                         TRUS/BX neg for tumor.  No asap or pin.  Volume= 45ml.  DRE Nl   03/2022             4.5 03/2023             9.0 04/2023  multiparametric prostate MRI shows no PI-RADS 3 4 or 5 lesions.     Prostate volume = 56 mL  Portions of the above documentation were copied from a prior visit for review purposes only.  Allergies: No Known Allergies  PMH: Past Medical History:  Diagnosis Date   Anemia    h/o 3 yrs. ago, used fe    Cancer (HCC)    Squamous, head, basal- behind ear    Disorder  of heart rhythm    "funny little beat"- last ekg- > one yr., ago [place] , [place] Dr. Whitney Muse-  ago, in [place], [place]   GERD (gastroesophageal reflux disease)    H/O exercise stress test    wnl, done due to irreg. heartrate, 10 yrs. ago in [place], [place]   [place]    rare migraines    Hyperlipidemia    Hypertension    history- off meds x4 months    Sinus infection    pt. reports that he has sinus infection currently, states that he will be started on antibiotic today    Squamous cell carcinoma of skin 07/15/2014   Left forearm(tx after bx)   Squamous cell carcinoma of skin 02/09/2016   Right forearm(tx after bx)    PSH: Past Surgical History:  Procedure Laterality Date   CHOLECYSTECTOMY  06/08/2012   Procedure: LAPAROSCOPIC CHOLECYSTECTOMY WITH INTRAOPERATIVE CHOLANGIOGRAM;  Surgeon: Lodema Pilot, DO;  Location: [facility] OR;  Service: General;  Laterality: N/A;   ELBOW SURGERY     left- ulnar nerve reposition     NASAL SINUS SURGERY     R side- for fungus infection - 2010   TONSILLECTOMY     as a youngster    SH: Social History   Tobacco Use   Smoking status: Never   Smokeless tobacco: Never  Vaping Use   Vaping status: Never Used  Substance Use Topics   Alcohol use: No   Drug use: No    ROS: Constitutional:  Negative for fever, chills, weight loss CV: Negative for chest pain, previous MI, hypertension Respiratory:  Negative for shortness of breath, wheezing, sleep apnea, frequent cough GI:  Negative for nausea, vomiting, bloody stool, GERD  PE: BP 127/82   Pulse 65   Ht 5\' 8"  (1.727 m)   Wt 180 lb (81.6 kg)   BMI 27.37 kg/m  GENERAL APPEARANCE:  Well appearing, well developed, well nourished, NAD

## 2023-10-21 ENCOUNTER — Encounter: Payer: Self-pay | Admitting: Urology

## 2023-12-20 ENCOUNTER — Ambulatory Visit: Payer: Medicare HMO | Admitting: Urology

## 2024-01-12 ENCOUNTER — Ambulatory Visit: Admitting: Urology

## 2024-01-12 ENCOUNTER — Encounter: Payer: Self-pay | Admitting: Urology

## 2024-01-12 VITALS — BP 120/78 | HR 75 | Ht 68.0 in | Wt 170.0 lb

## 2024-01-12 DIAGNOSIS — N401 Enlarged prostate with lower urinary tract symptoms: Secondary | ICD-10-CM | POA: Diagnosis not present

## 2024-01-12 DIAGNOSIS — R972 Elevated prostate specific antigen [PSA]: Secondary | ICD-10-CM | POA: Diagnosis not present

## 2024-01-12 LAB — URINALYSIS, ROUTINE W REFLEX MICROSCOPIC
Bilirubin, UA: NEGATIVE
Glucose, UA: NEGATIVE
Ketones, UA: NEGATIVE
Leukocytes,UA: NEGATIVE
Nitrite, UA: NEGATIVE
Protein,UA: NEGATIVE
RBC, UA: NEGATIVE
Specific Gravity, UA: 1.025 (ref 1.005–1.030)
Urobilinogen, Ur: 0.2 mg/dL (ref 0.2–1.0)
pH, UA: 5.5 (ref 5.0–7.5)

## 2024-01-12 NOTE — Progress Notes (Signed)
 Assessment: 1. Elevated PSA   2. Benign localized prostatic hyperplasia with lower urinary tract symptoms (LUTS)     Plan: I personally reviewed the patient's chart including provider notes, lab and imaging results. Continue finasteride . Free and total PSA today. Return to office in 6 months.  Chief Complaint: Chief Complaint  Patient presents with   Elevated PSA    HPI: [name] is a 73 y.o. male who presents for continued evaluation of BPH with LUTS and elevated PSA. He was previously evaluated by Dr. Del Favia and was last seen in October 2024.  Elevated PSA: Patient has previously been seen by [facility]. No fam hx of CaP.   PSA // Prostate Data: 09/2018             2.9 11/2019             2.6 12/2020             4.5 01/2022             6.8 (20% free)                         TRUS/BX neg for tumor.  No asap or pin.  Volume= 45ml.  DRE Nl   03/2022             4.5 03/2023             9.0 05/2023 MRI - no suspicious lesions; vol 56 ml. 12/24  4.9  BPH with LUTS: Patient reported progressive worsening LUTS.  No prior med mgmt. IPSS= 23/3 He was started on tamsulosin  0.4 mg daily in September 2024.  Finasteride  5 mg daily was added in October 2024.  He returns today for follow-up.  He is no longer taking tamsulosin .  He discontinued this due to concern for possible side effect of blurred vision.  He continues on finasteride .  He has not noted any worsening of his urinary symptoms since stopping the tamsulosin .  He continues to have some intermittently slow stream and hesitancy.  No dysuria or gross hematuria. IPSS = 13/2.  Portions of the above documentation were copied from a prior visit for review purposes only.  Allergies: No Known Allergies  PMH: Past Medical History:  Diagnosis Date   Anemia    h/o 3 yrs. ago, used fe    Cancer (HCC)    Squamous, head, basal- behind ear    Disorder of heart rhythm    "funny little beat"- last ekg- > one yr., ago [place] ,  [place] Dr. Simonne Dubonnet-  ago, in [place], [place]   GERD (gastroesophageal reflux disease)    H/O exercise stress test    wnl, done due to irreg. heartrate, 10 yrs. ago in [place], [place]   [place]    rare migraines    Hyperlipidemia    Hypertension    history- off meds x4 months    Sinus infection    pt. reports that he has sinus infection currently, states that he will be started on antibiotic today    Squamous cell carcinoma of skin 07/15/2014   Left forearm(tx after bx)   Squamous cell carcinoma of skin 02/09/2016   Right forearm(tx after bx)    PSH: Past Surgical History:  Procedure Laterality Date   CHOLECYSTECTOMY  06/08/2012   Procedure: LAPAROSCOPIC CHOLECYSTECTOMY WITH INTRAOPERATIVE CHOLANGIOGRAM;  Surgeon: Evander Hills, DO;  Location: [facility] OR;  Service: General;  Laterality: N/A;   ELBOW SURGERY  left- ulnar nerve reposition     NASAL SINUS SURGERY     R side- for fungus infection - 2010   TONSILLECTOMY     as a youngster    SH: Social History   Tobacco Use   Smoking status: Never   Smokeless tobacco: Never  Vaping Use   Vaping status: Never Used  Substance Use Topics   Alcohol use: No   Drug use: No    ROS: Constitutional:  Negative for fever, chills, weight loss CV: Negative for chest pain, previous MI, hypertension Respiratory:  Negative for shortness of breath, wheezing, sleep apnea, frequent cough GI:  Negative for nausea, vomiting, bloody stool, GERD  PE: BP 120/78   Pulse 75   Ht 5\' 8"  (1.727 m)   Wt 170 lb (77.1 kg)   BMI 25.85 kg/m  GENERAL APPEARANCE:  Well appearing, well developed, well nourished, NAD HEENT:  Atraumatic, normocephalic, oropharynx clear NECK:  Supple without lymphadenopathy or thyromegaly ABDOMEN:  Soft, non-tender, no masses EXTREMITIES:  Moves all extremities well, without clubbing, cyanosis, or edema NEUROLOGIC:  Alert and oriented x 3, normal gait, CN II-XII grossly intact MENTAL STATUS:  appropriate BACK:   Non-tender to palpation, No CVAT SKIN:  Warm, dry, and intact   Results: U/A: Negative

## 2024-01-13 ENCOUNTER — Ambulatory Visit: Payer: Self-pay | Admitting: Urology

## 2024-01-13 LAB — PSA, TOTAL AND FREE
PSA, Free Pct: 23.8 %
PSA, Free: 0.57 ng/mL
Prostate Specific Ag, Serum: 2.4 ng/mL (ref 0.0–4.0)

## 2024-06-06 ENCOUNTER — Ambulatory Visit: Admitting: Urology

## 2024-06-06 ENCOUNTER — Encounter: Payer: Self-pay | Admitting: Urology

## 2024-06-06 VITALS — BP 134/83 | HR 56 | Ht 68.0 in | Wt 170.0 lb

## 2024-06-06 DIAGNOSIS — N401 Enlarged prostate with lower urinary tract symptoms: Secondary | ICD-10-CM | POA: Diagnosis not present

## 2024-06-06 DIAGNOSIS — R972 Elevated prostate specific antigen [PSA]: Secondary | ICD-10-CM | POA: Diagnosis not present

## 2024-06-06 LAB — URINALYSIS, ROUTINE W REFLEX MICROSCOPIC
Bilirubin, UA: NEGATIVE
Glucose, UA: NEGATIVE
Ketones, UA: NEGATIVE
Leukocytes,UA: NEGATIVE
Nitrite, UA: NEGATIVE
Protein,UA: NEGATIVE
RBC, UA: NEGATIVE
Specific Gravity, UA: 1.015 (ref 1.005–1.030)
Urobilinogen, Ur: 0.2 mg/dL (ref 0.2–1.0)
pH, UA: 6.5 (ref 5.0–7.5)

## 2024-06-06 NOTE — Progress Notes (Signed)
 Assessment: 1. Elevated PSA   2. Benign localized prostatic hyperplasia with lower urinary tract symptoms (LUTS)     Plan: Will monitor LUTS off of medications Return to office in 6 months.  Chief Complaint: Chief Complaint  Patient presents with   Elevated PSA    HPI: Kevin Mahoney is a 73 y.o. male who presents for continued evaluation of BPH with LUTS and elevated PSA. He was previously evaluated by Dr. Shona and was last seen in October 2024.  Elevated PSA: Patient has previously been seen by [facility]. No fam hx of CaP.   PSA // Prostate Data: 09/2018             2.9 11/2019             2.6 12/2020             4.5 01/2022             6.8 (20% free)                         TRUS/BX neg for tumor.  No asap or pin.  Volume= 45ml.  DRE Nl   03/2022             4.5 03/2023             9.0 05/2023 MRI - no suspicious lesions; vol 56 ml. 12/24  4.9 5/25  2.4 (23.8% free) - 4.8 corrected for finasteride  9/25  4.1  BPH with LUTS: Patient reported progressive worsening LUTS.  No prior med mgmt. IPSS= 23/3 He was started on tamsulosin  0.4 mg daily in September 2024.  Finasteride  5 mg daily was added in October 2024.  At his visit in May 2025, he was no longer taking tamsulosin .  He discontinued this due to concern for possible side effect of blurred vision.  He continued on finasteride .  He haf not noted any worsening of his urinary symptoms since stopping the tamsulosin .  He continuef to have some intermittently slow stream and hesitancy.  No dysuria or gross hematuria. IPSS = 13/2.  He returns today for follow-up.  He discontinued the finasteride  approximately 2 months ago.  He is taking saw palmetto.  No change in his urinary symptoms.  He is not having any dysuria or gross hematuria. IPSS = 4/1.  Portions of the above documentation were copied from a prior visit for review purposes only.  Allergies: No Known Allergies  PMH: Past Medical History:  Diagnosis Date    Anemia    h/o 3 yrs. ago, used fe    Cancer (HCC)    Squamous, head, basal- behind ear    Disorder of heart rhythm    funny little beat- last ekg- > one yr., ago [place] , [place] Dr. Monta Hopper-  ago, in [place], [place]   GERD (gastroesophageal reflux disease)    H/O exercise stress test    wnl, done due to irreg. heartrate, 10 yrs. ago in [place], [place]   [place]    rare migraines    Hyperlipidemia    Hypertension    history- off meds x4 months    Sinus infection    pt. reports that he has sinus infection currently, states that he will be started on antibiotic today    Squamous cell carcinoma of skin 07/15/2014   Left forearm(tx after bx)   Squamous cell carcinoma of skin 02/09/2016   Right forearm(tx after bx)    PSH: Past  Surgical History:  Procedure Laterality Date   CHOLECYSTECTOMY  06/08/2012   Procedure: LAPAROSCOPIC CHOLECYSTECTOMY WITH INTRAOPERATIVE CHOLANGIOGRAM;  Surgeon: Redell Faith, DO;  Location: [facility] OR;  Service: General;  Laterality: N/A;   ELBOW SURGERY     left- ulnar nerve reposition     NASAL SINUS SURGERY     R side- for fungus infection - 2010   TONSILLECTOMY     as a youngster    SH: Social History   Tobacco Use   Smoking status: Never   Smokeless tobacco: Never  Vaping Use   Vaping status: Never Used  Substance Use Topics   Alcohol use: No   Drug use: No    ROS: Constitutional:  Negative for fever, chills, weight loss CV: Negative for chest pain, previous MI, hypertension Respiratory:  Negative for shortness of breath, wheezing, sleep apnea, frequent cough GI:  Negative for nausea, vomiting, bloody stool, GERD   PE: BP 134/83   Pulse (!) 56   Ht 5' 8 (1.727 m)   Wt 170 lb (77.1 kg)   BMI 25.85 kg/m  GENERAL APPEARANCE:  Well appearing, well developed, well nourished, NAD HEENT:  Atraumatic, normocephalic, oropharynx clear NECK:  Supple without lymphadenopathy or thyromegaly ABDOMEN:  Soft, non-tender, no  masses EXTREMITIES:  Moves all extremities well, without clubbing, cyanosis, or edema NEUROLOGIC:  Alert and oriented x 3, normal gait, CN II-XII grossly intact MENTAL STATUS:  appropriate BACK:  Non-tender to palpation, No CVAT SKIN:  Warm, dry, and intact GU:  patient declined prostate exam today  Results: U/A: negative

## 2025-01-08 ENCOUNTER — Ambulatory Visit: Admitting: Urology
